# Patient Record
Sex: Female | Born: 1985 | Race: Black or African American | Hispanic: No | Marital: Single | Smoking: Never smoker
Health system: Southern US, Community
[De-identification: ages and names within clinical notes are randomized; demographics above are authoritative.]

## PROBLEM LIST (undated history)

## (undated) ENCOUNTER — Inpatient Hospital Stay (HOSPITAL_COMMUNITY): Payer: Self-pay

## (undated) DIAGNOSIS — G43909 Migraine, unspecified, not intractable, without status migrainosus: Secondary | ICD-10-CM

## (undated) DIAGNOSIS — R87619 Unspecified abnormal cytological findings in specimens from cervix uteri: Secondary | ICD-10-CM

## (undated) DIAGNOSIS — IMO0002 Reserved for concepts with insufficient information to code with codable children: Secondary | ICD-10-CM

---
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
## 2004-03-13 ENCOUNTER — Other Ambulatory Visit: Admission: RE | Admit: 2004-03-13 | Discharge: 2004-03-13 | Payer: Self-pay | Admitting: Obstetrics and Gynecology

## 2004-03-21 ENCOUNTER — Emergency Department (HOSPITAL_COMMUNITY): Admission: EM | Admit: 2004-03-21 | Discharge: 2004-03-21 | Payer: Self-pay | Admitting: Emergency Medicine

## 2004-09-14 ENCOUNTER — Other Ambulatory Visit: Admission: RE | Admit: 2004-09-14 | Discharge: 2004-09-14 | Payer: Self-pay | Admitting: Obstetrics and Gynecology

## 2005-04-11 ENCOUNTER — Other Ambulatory Visit: Admission: RE | Admit: 2005-04-11 | Discharge: 2005-04-11 | Payer: Self-pay | Admitting: Obstetrics and Gynecology

## 2005-08-15 ENCOUNTER — Inpatient Hospital Stay (HOSPITAL_COMMUNITY): Admission: AD | Admit: 2005-08-15 | Discharge: 2005-08-15 | Payer: Self-pay | Admitting: Obstetrics and Gynecology

## 2005-10-09 ENCOUNTER — Inpatient Hospital Stay (HOSPITAL_COMMUNITY): Admission: AD | Admit: 2005-10-09 | Discharge: 2005-10-10 | Payer: Self-pay | Admitting: Obstetrics and Gynecology

## 2005-10-14 HISTORY — PX: LEEP: SHX91

## 2005-10-20 ENCOUNTER — Inpatient Hospital Stay (HOSPITAL_COMMUNITY): Admission: AD | Admit: 2005-10-20 | Discharge: 2005-10-23 | Payer: Self-pay | Admitting: Obstetrics and Gynecology

## 2005-10-21 ENCOUNTER — Encounter (INDEPENDENT_AMBULATORY_CARE_PROVIDER_SITE_OTHER): Payer: Self-pay | Admitting: *Deleted

## 2005-12-03 ENCOUNTER — Other Ambulatory Visit: Admission: RE | Admit: 2005-12-03 | Discharge: 2005-12-03 | Payer: Self-pay | Admitting: Obstetrics and Gynecology

## 2005-12-05 ENCOUNTER — Inpatient Hospital Stay (HOSPITAL_COMMUNITY): Admission: AD | Admit: 2005-12-05 | Discharge: 2005-12-05 | Payer: Self-pay | Admitting: Obstetrics and Gynecology

## 2006-02-13 ENCOUNTER — Inpatient Hospital Stay (HOSPITAL_COMMUNITY): Admission: AD | Admit: 2006-02-13 | Discharge: 2006-02-13 | Payer: Self-pay | Admitting: Obstetrics and Gynecology

## 2006-04-29 ENCOUNTER — Other Ambulatory Visit: Admission: RE | Admit: 2006-04-29 | Discharge: 2006-04-29 | Payer: Self-pay | Admitting: Obstetrics and Gynecology

## 2006-05-28 ENCOUNTER — Inpatient Hospital Stay (HOSPITAL_COMMUNITY): Admission: AD | Admit: 2006-05-28 | Discharge: 2006-05-28 | Payer: Self-pay | Admitting: Obstetrics and Gynecology

## 2006-08-18 ENCOUNTER — Inpatient Hospital Stay (HOSPITAL_COMMUNITY): Admission: AD | Admit: 2006-08-18 | Discharge: 2006-08-18 | Payer: Self-pay | Admitting: Obstetrics and Gynecology

## 2006-10-16 ENCOUNTER — Other Ambulatory Visit: Admission: RE | Admit: 2006-10-16 | Discharge: 2006-10-16 | Payer: Self-pay | Admitting: Obstetrics and Gynecology

## 2008-02-10 ENCOUNTER — Emergency Department (HOSPITAL_COMMUNITY): Admission: EM | Admit: 2008-02-10 | Discharge: 2008-02-11 | Payer: Self-pay | Admitting: Emergency Medicine

## 2008-03-11 ENCOUNTER — Emergency Department (HOSPITAL_COMMUNITY): Admission: EM | Admit: 2008-03-11 | Discharge: 2008-03-11 | Payer: Self-pay | Admitting: Emergency Medicine

## 2008-06-24 ENCOUNTER — Emergency Department (HOSPITAL_COMMUNITY): Admission: EM | Admit: 2008-06-24 | Discharge: 2008-06-24 | Payer: Self-pay | Admitting: Emergency Medicine

## 2011-03-01 NOTE — Discharge Summary (Signed)
NAME:  Margaret Chaney, Margaret Chaney          ACCOUNT NO.:  [id number]   MEDICAL RECORD NO.:  [id number]          PATIENT TYPE:  INP   LOCATION:  9145                          FACILITY:  [facility]   PHYSICIAN:  James A. Ashley Royalty, M.D.DATE OF BIRTH:  1986/09/25   DATE OF ADMISSION:  10/20/2005  DATE OF DISCHARGE:  10/23/2005                                 DISCHARGE SUMMARY   DISCHARGE DIAGNOSES:  1.  Intrauterine pregnancy at 39 weeks and 1 day gestation, delivered.  2.  Term birth living child vertex.   OPERATIONS AND SPECIAL PROCEDURES:  OB delivery.   CONSULTATIONS:  None.   DISCHARGE MEDICATIONS:  Motrin 600 mg.   HISTORY AND PHYSICAL:  This is a 25 year old gravida 2, para 1 at [redacted] weeks  gestation.  Prenatal care had been uncomplicated until the patient felt  musculoskeletal discomfort near end at term.  She was noted to have advanced  cervical changes and hence was admitted for induction secondary to above.  For the remainder of the history and physical, please see chart.   HOSPITAL COURSE:  The patient was admitted to [facility]  [place].  Admission laboratory studies were drawn.  The patient went on  to labor and deliver on October 21, 2005.  The infant was a 7 pound 9 ounce  female.  Apgars 9 at one minute and 9 at five minutes, and sent to the  newborn nursery.  The delivery was accomplished by Dr. Lonell Face over an  intact perineum.  There were no lacerations.  The patient's postpartum  course was benign.  She was discharged on October 23, 2005 afebrile and in  satisfactory condition.   DISPOSITION:  The patient is to return to [facility] and Obstetrics  in four to six weeks for postpartum evaluation.      James A. Ashley Royalty, M.D.  Electronically Signed     JAM/MEDQ  D:  11/20/2005  T:  11/20/2005  Job:  [id number]

## 2011-03-01 NOTE — H&P (Signed)
NAME:  Chaney, Margaret          ACCOUNT NO.:  [id number]   MEDICAL RECORD NO.:  [id number]          PATIENT TYPE:  [facility]   LOCATION:                                FACILITY:  [facility]P   PHYSICIAN:  James A. Ashley Royalty, M.D.DATE OF BIRTH:  [birth year]   DATE OF ADMISSION:  10/20/2005  DATE OF DISCHARGE:                                HISTORY & PHYSICAL   This is a 25 year old gravida 2, para 1, EDC October 26, 2005 39 weeks 1 day  gestation.  Prenatal care has been essentially uncomplicated until recently  when the patient has developed musculoskeletal discomfort near and at term.  Recent cervical examination revealed 4 cm dilatation, 80% effaced, but -1 to  0 station and a vertex presentation.  Patient admitted for induction  secondary to above.   MEDICATIONS:  Vitamins.   PAST MEDICAL HISTORY:   MEDICAL:  Negative.   SURGICAL:  Negative.   ALLERGIES:  None disclosed.   FAMILY HISTORY:  Noncontributory.   SOCIAL HISTORY:  Patient denies use of tobacco or significant alcohol.   REVIEW OF SYSTEMS:  Noncontributory.   PHYSICAL EXAMINATION:  GENERAL:  Well-developed, well-nourished, pleasant  black female in no acute distress.  VITAL SIGNS:  Afebrile.  Vital signs stable.  CHEST:  Lungs are clear.  CARDIAC:  Regular rate and rhythm.  ABDOMEN:  Gravid with a term fundal height.  Fetal heart tones are  auscultated.  MUSCULOSKELETAL:  No CVA tenderness.  PELVIC:  (Please see October 17, 2005 office note).   IMPRESSION:  1.  Intrauterine pregnancy at 39 weeks 1 day gestation.  2.  Musculoskeletal discomfort.  3.  Advanced cervical changes.   PLAN:  1.  Admit.  2.  Induction in a.m.      James A. Ashley Royalty, M.D.  Electronically Signed     JAM/MEDQ  D:  10/18/2005  T:  10/18/2005  Job:  [id number]

## 2011-06-06 ENCOUNTER — Emergency Department (HOSPITAL_COMMUNITY): Payer: Self-pay

## 2011-06-06 ENCOUNTER — Emergency Department (HOSPITAL_COMMUNITY)
Admission: EM | Admit: 2011-06-06 | Discharge: 2011-06-06 | Disposition: A | Discharge status: Home / Self Care | Payer: Self-pay | Attending: Emergency Medicine | Admitting: Emergency Medicine

## 2011-06-06 DIAGNOSIS — R10819 Abdominal tenderness, unspecified site: Secondary | ICD-10-CM | POA: Insufficient documentation

## 2011-06-06 DIAGNOSIS — N898 Other specified noninflammatory disorders of vagina: Secondary | ICD-10-CM | POA: Insufficient documentation

## 2011-06-06 DIAGNOSIS — O99891 Other specified diseases and conditions complicating pregnancy: Secondary | ICD-10-CM | POA: Insufficient documentation

## 2011-06-06 DIAGNOSIS — R112 Nausea with vomiting, unspecified: Secondary | ICD-10-CM | POA: Insufficient documentation

## 2011-06-06 DIAGNOSIS — R109 Unspecified abdominal pain: Secondary | ICD-10-CM | POA: Insufficient documentation

## 2011-06-06 LAB — URINALYSIS, ROUTINE W REFLEX MICROSCOPIC
Bilirubin Urine: NEGATIVE
Glucose, UA: NEGATIVE mg/dL
Ketones, ur: NEGATIVE mg/dL
Leukocytes, UA: NEGATIVE
Nitrite: NEGATIVE
Protein, ur: NEGATIVE mg/dL
Specific Gravity, Urine: 1.024 (ref 1.005–1.030)
Urobilinogen, UA: 1 mg/dL (ref 0.0–1.0)
pH: 6 (ref 5.0–8.0)

## 2011-06-06 LAB — WET PREP, GENITAL
Clue Cells Wet Prep HPF POC: NONE SEEN
Trich, Wet Prep: NONE SEEN
Yeast Wet Prep HPF POC: NONE SEEN

## 2011-06-06 LAB — URINE MICROSCOPIC-ADD ON

## 2011-06-06 LAB — HCG, QUANTITATIVE, PREGNANCY: hCG, Beta Chain, Quant, S: 15156 m[IU]/mL — ABNORMAL HIGH (ref <5)

## 2011-06-07 LAB — GC/CHLAMYDIA PROBE AMP, GENITAL
Chlamydia, DNA Probe: NEGATIVE
GC Probe Amp, Genital: NEGATIVE

## 2011-07-17 LAB — WET PREP, GENITAL
Clue Cells Wet Prep HPF POC: NONE SEEN
Trich, Wet Prep: NONE SEEN
Yeast Wet Prep HPF POC: NONE SEEN

## 2011-07-17 LAB — GC/CHLAMYDIA PROBE AMP, GENITAL: Chlamydia, DNA Probe: NEGATIVE

## 2011-07-17 LAB — URINE CULTURE: Colony Count: 85000

## 2011-07-17 LAB — URINALYSIS, ROUTINE W REFLEX MICROSCOPIC
Bilirubin Urine: NEGATIVE
Ketones, ur: NEGATIVE
Nitrite: NEGATIVE
Protein, ur: NEGATIVE
Urobilinogen, UA: 1

## 2011-09-04 ENCOUNTER — Encounter: Payer: Self-pay | Admitting: Emergency Medicine

## 2011-09-04 ENCOUNTER — Emergency Department (HOSPITAL_COMMUNITY)
Admission: EM | Admit: 2011-09-04 | Discharge: 2011-09-04 | Disposition: A | Discharge status: Home / Self Care | Payer: Self-pay | Attending: Emergency Medicine | Admitting: Emergency Medicine

## 2011-09-04 DIAGNOSIS — R51 Headache: Secondary | ICD-10-CM | POA: Insufficient documentation

## 2011-09-04 MED ORDER — GUAIFENESIN ER 1200 MG PO TB12: 1.0000 | ORAL_TABLET | Freq: Two times a day (BID) | ORAL | Status: DC

## 2011-09-04 MED ORDER — KETOROLAC TROMETHAMINE 60 MG/2ML IM SOLN
60.0000 mg | Freq: Once | INTRAMUSCULAR | Status: AC
  Administered 2011-09-04: 60 mg via INTRAMUSCULAR
  Filled 2011-09-04: qty 2

## 2011-09-04 MED ORDER — IBUPROFEN 800 MG PO TABS: 800.0000 mg | ORAL_TABLET | Freq: Three times a day (TID) | ORAL | Status: AC | PRN

## 2011-09-04 MED ORDER — PREDNISONE 50 MG PO TABS: 50.0000 mg | ORAL_TABLET | Freq: Every day | ORAL | Status: AC

## 2011-09-04 NOTE — ED Notes (Signed)
In ER c/o headache x2 days. Denies any nausea vomiting. Patient stated she has had headache in the past, last one before this was 2 weeks ago

## 2011-09-04 NOTE — ED Provider Notes (Signed)
History     CSN: [id number] Arrival date & time: 09/04/2011  5:54 AM   First MD Initiated Contact with Patient 09/04/11 [phone]      Chief Complaint  Patient presents with  . Headache    headache x2 days    (Consider location/radiation/quality/duration/timing/severity/associated sxs/prior treatment) Patient is a 25 y.o. female presenting with headaches. The history is provided by the patient.  Headache  The current episode started 2 days ago. The problem occurs constantly. The problem has not changed since onset.The headache is associated with nothing. The pain is located in the frontal region. The quality of the pain is described as throbbing. The pain does not radiate. Pertinent negatives include no anorexia, no fever, no near-syncope, no palpitations, no syncope, no shortness of breath, no nausea and no vomiting. She has tried nothing for the symptoms.    History reviewed. No pertinent past medical history.  History reviewed. No pertinent past surgical history.  History reviewed. No pertinent family history.  History  Substance Use Topics  . Smoking status: Never Smoker   . Smokeless tobacco: Not on file  . Alcohol Use: No    OB History    Grav Para Term Preterm Abortions TAB SAB Ect Mult Living                  Review of Systems  Constitutional: Negative for fever.  Respiratory: Negative for shortness of breath.   Cardiovascular: Negative for palpitations, syncope and near-syncope.  Gastrointestinal: Negative for nausea, vomiting and anorexia.  Neurological: Positive for headaches.  All other systems reviewed and are negative.    Allergies  Review of patient's allergies indicates no known allergies.  Home Medications   Current Outpatient Rx  Name Route Sig Dispense Refill  . ASPIRIN EC 81 MG PO TBEC Oral Take 81 mg by mouth every 6 (six) hours as needed. pain     . CETIRIZINE HCL 10 MG PO TABS Oral Take 10 mg by mouth daily. Allergic reaction     .  MEDROXYPROGESTERONE ACETATE 150 MG/ML IM SUSP Intramuscular Inject 150 mg into the muscle every 3 (three) months.        BP 120/81  Pulse 76  Temp(Src) 98.6 F (37 C) (Oral)  Resp 16  Ht 5\' 4"  (1.626 m)  Wt 119 lb (53.978 kg)  BMI 20.43 kg/m2  SpO2 99%  LMP 08/29/2011  Physical Exam  Nursing note and vitals reviewed. Constitutional: She is oriented to person, place, and time. She appears well-developed and well-nourished. No distress.  HENT:  Head: Normocephalic and atraumatic.  Right Ear: Tympanic membrane, external ear and ear canal normal.  Left Ear: Tympanic membrane, external ear and ear canal normal.  Nose: No mucosal edema, rhinorrhea, sinus tenderness or nasal deformity. Right sinus exhibits no maxillary sinus tenderness and no frontal sinus tenderness. Left sinus exhibits no maxillary sinus tenderness and no frontal sinus tenderness.  Mouth/Throat: Uvula is midline, oropharynx is clear and moist and mucous membranes are normal.  Eyes: EOM are normal. Pupils are equal, round, and reactive to light.  Neck: Normal range of motion. Neck supple.  Cardiovascular: Normal rate, regular rhythm and normal heart sounds.   Pulmonary/Chest: Effort normal and breath sounds normal.  Neurological: She is alert and oriented to person, place, and time. She has normal strength and normal reflexes. No cranial nerve deficit or sensory deficit. Gait normal. GCS eye subscore is 4. GCS verbal subscore is 5. GCS motor subscore is 6.  Skin: Skin  is warm and dry. No rash noted.    ED Course  Procedures (including critical care time)          MDM  Patient states, that she feels like to her sinuses are causing her headache.  The patient denies any nasal congestion, rhinorrhea, or upper respiratory symptoms.  She has not had any tenderness to her sinuses on percussion of treatment.  For her headache, and her followup with her primary care or urgent care center for recheck.  Told to return here  as needed.  Patient has no neurological deficits on exam, and this headache does not seem to follow a migraine type pattern.  This could be related to allergic type symptoms.        Carlyle Dolly, PA 09/04/11 0646  Carlyle Dolly, PA 09/04/11 [phone]  Carlyle Dolly, PA 09/04/11 0701  Carlyle Dolly, PA 09/04/11 [phone]

## 2011-09-05 NOTE — ED Provider Notes (Signed)
Medical screening examination/treatment/procedure(s) were performed by non-physician practitioner and as supervising physician I was immediately available for consultation/collaboration.   Gwyneth Sprout, MD 09/05/11 [phone]

## 2011-10-02 ENCOUNTER — Encounter (HOSPITAL_COMMUNITY): Payer: Self-pay | Admitting: Emergency Medicine

## 2011-10-02 ENCOUNTER — Emergency Department (HOSPITAL_COMMUNITY)
Admission: EM | Admit: 2011-10-02 | Discharge: 2011-10-02 | Disposition: A | Discharge status: Home / Self Care | Payer: Self-pay | Attending: Emergency Medicine | Admitting: Emergency Medicine

## 2011-10-02 ENCOUNTER — Other Ambulatory Visit: Payer: Self-pay

## 2011-10-02 DIAGNOSIS — R42 Dizziness and giddiness: Secondary | ICD-10-CM | POA: Insufficient documentation

## 2011-10-02 LAB — URINALYSIS, ROUTINE W REFLEX MICROSCOPIC
Bilirubin Urine: NEGATIVE
Glucose, UA: NEGATIVE mg/dL
Hgb urine dipstick: NEGATIVE
Ketones, ur: NEGATIVE mg/dL
Leukocytes, UA: NEGATIVE
Nitrite: NEGATIVE
Protein, ur: NEGATIVE mg/dL
Specific Gravity, Urine: 1.029 (ref 1.005–1.030)
Urobilinogen, UA: 1 mg/dL (ref 0.0–1.0)
pH: 8 (ref 5.0–8.0)

## 2011-10-02 LAB — POCT PREGNANCY, URINE: Preg Test, Ur: NEGATIVE

## 2011-10-02 NOTE — ED Notes (Signed)
PT. REPORTS DIZZINESS "FEELING FAINT"  ONSET THIS EVENING WITH HEADACHE AND NAUSEA.  ALSO REPORTS DYSURIA.

## 2011-10-02 NOTE — ED Provider Notes (Signed)
History     25yF with dizziness. Describes as sensation of lightheadedness. No true vertigo. Gradual onset this evening. Mild HA. Diffuse, achy and constant. No appreciable exacerbating or relieving factors. NO neck pain or stiffness. No fever or chills. No numbness tingling or less of strength. Denies trauma. Nausea. No vomiting. No cp or sob. Doesn't think pregnant. No urinary complaints.  CSN: [id number] Arrival date & time: 10/02/2011 12:54 AM   First MD Initiated Contact with Patient 10/02/11 0121      Chief Complaint  Patient presents with  . Dizziness    (Consider location/radiation/quality/duration/timing/severity/associated sxs/prior treatment) HPI  History reviewed. No pertinent past medical history.  History reviewed. No pertinent past surgical history.  No family history on file.  History  Substance Use Topics  . Smoking status: Never Smoker   . Smokeless tobacco: Not on file  . Alcohol Use: No    OB History    Grav Para Term Preterm Abortions TAB SAB Ect Mult Living                  Review of Systems   Review of symptoms negative unless otherwise noted in HPI.   Allergies  Review of patient's allergies indicates no known allergies.  Home Medications   Current Outpatient Rx  Name Route Sig Dispense Refill  . ASPIRIN EC 81 MG PO TBEC Oral Take 81 mg by mouth every 6 (six) hours as needed. pain     . CETIRIZINE HCL 10 MG PO TABS Oral Take 10 mg by mouth daily. Allergic reaction     . GUAIFENESIN ER 1200 MG PO TB12 Oral Take 1 tablet (1,200 mg total) by mouth 2 (two) times daily. 20 each 0  . HYDROCODONE-ACETAMINOPHEN 5-325 MG PO TABS Oral Take 1 tablet by mouth every 6 (six) hours as needed. pain     . MEDROXYPROGESTERONE ACETATE 150 MG/ML IM SUSP Intramuscular Inject 150 mg into the muscle every 3 (three) months.        BP 123/65  Pulse 68  Temp(Src) 98.6 F (37 C) (Oral)  Resp 18  SpO2 100%  LMP 08/29/2011  Physical Exam  Nursing note and  vitals reviewed. Constitutional: She is oriented to person, place, and time. She appears well-developed and well-nourished. No distress.  HENT:  Head: Normocephalic and atraumatic.  Eyes: Conjunctivae are normal. Pupils are equal, round, and reactive to light. Right eye exhibits no discharge. Left eye exhibits no discharge.  Neck: Neck supple.  Cardiovascular: Normal rate, regular rhythm and normal heart sounds.  Exam reveals no gallop and no friction rub.   No murmur heard. Pulmonary/Chest: Effort normal and breath sounds normal. No respiratory distress. She has no wheezes. She has no rales. She exhibits no tenderness.  Abdominal: Soft. She exhibits no distension. There is no tenderness.  Musculoskeletal: She exhibits no edema and no tenderness.  Neurological: She is alert and oriented to person, place, and time. No cranial nerve deficit. She exhibits normal muscle tone. Coordination normal.       Good finger-to-nose. Normal gait.  Skin: Skin is warm and dry. She is not diaphoretic.  Psychiatric: She has a normal mood and affect. Her behavior is normal. Thought content normal.    ED Course  Procedures (including critical care time)   Labs Reviewed  URINALYSIS, ROUTINE W REFLEX MICROSCOPIC  POCT PREGNANCY, URINE  LAB REPORT - SCANNED   No results found.  EKG:  Rhythm: sinus arrythmia Rate: 64  Axis: normal Intervals: normal ST  segments: normal  1. Dizziness - light-headed       MDM  25yF with dizziness consider dehydration, lytes, anemia, pregnancy, pe, viral, etc. ekg non concerning. Not pregnant. Pt clinically well appearing with nonfocal exam. HD stable. Do not feel further w/u indicated at this time. outpt fu.        Raeford Razor, MD 10/10/11 [phone]

## 2011-10-02 NOTE — ED Notes (Signed)
Pt ambulated with a steady gait; VSS; A&Ox3; no signs of distress; respirations even and unlabored; skin warm and dry; no complaints; no questions at this time.

## 2011-11-28 ENCOUNTER — Encounter (HOSPITAL_COMMUNITY): Payer: Self-pay | Admitting: Emergency Medicine

## 2011-11-28 ENCOUNTER — Emergency Department (HOSPITAL_COMMUNITY)
Admission: EM | Admit: 2011-11-28 | Discharge: 2011-11-28 | Disposition: A | Discharge status: Home / Self Care | Payer: Medicaid Other | Attending: Emergency Medicine | Admitting: Emergency Medicine

## 2011-11-28 ENCOUNTER — Emergency Department (HOSPITAL_COMMUNITY): Payer: Medicaid Other

## 2011-11-28 ENCOUNTER — Other Ambulatory Visit: Payer: Self-pay

## 2011-11-28 DIAGNOSIS — R0602 Shortness of breath: Secondary | ICD-10-CM

## 2011-11-28 DIAGNOSIS — R079 Chest pain, unspecified: Secondary | ICD-10-CM | POA: Insufficient documentation

## 2011-11-28 MED ORDER — ALBUTEROL SULFATE HFA 108 (90 BASE) MCG/ACT IN AERS
2.0000 | INHALATION_SPRAY | Freq: Once | RESPIRATORY_TRACT | Status: AC
  Administered 2011-11-28: 2 via RESPIRATORY_TRACT
  Filled 2011-11-28: qty 6.7

## 2011-11-28 NOTE — ED Provider Notes (Signed)
History     CSN: [id number]  Arrival date & time 11/28/11  1759  9:50 PM  HPI Patient reports the onset of left sided chest pain that occurred today. Reports having 3 episodes throughout the day. States episode lasted approximately 10-15 minutes. Describes pain as sharp constant. Reports associated with shortness of breath. Currently pain is 0/10. Worst pain is 10/10. Denies hypertension, hyperlipidemia, cough, wheezing, recent immobilization, surgery, fever, sore throat. No significant history or risk factors for ACS, DVT, PE, pneumonia, PTX. Patient however is on Depo injections.  Patient is a 26 y.o. female presenting with chest pain. The history is provided by the patient.  Chest Pain Episode onset: today. Duration of episode(s) is 10 minutes. Chest pain occurs intermittently. The chest pain is resolved. The severity of the pain is moderate. The quality of the pain is described as sharp. The pain does not radiate. Primary symptoms include shortness of breath. Pertinent negatives for primary symptoms include no fever, no fatigue, no syncope, no cough, no wheezing, no palpitations, no abdominal pain, no nausea, no vomiting, no dizziness and no altered mental status. She tried nothing for the symptoms. Risk factors include no known risk factors.  Pertinent negatives for past medical history include no aortic dissection, no cancer, no CHF, no diabetes, no DVT, no hyperlipidemia, no hypertension and no MI.  Pertinent negatives for family medical history include: no sudden death in family.     History reviewed. No pertinent past medical history.  History reviewed. No pertinent past surgical history.  History reviewed. No pertinent family history.  History  Substance Use Topics  . Smoking status: Never Smoker   . Smokeless tobacco: Not on file  . Alcohol Use: No    OB History    Grav Para Term Preterm Abortions TAB SAB Ect Mult Living                  Review of Systems    Constitutional: Positive for chills. Negative for fever and fatigue.  HENT: Negative for congestion, sore throat and rhinorrhea.   Respiratory: Positive for shortness of breath. Negative for cough and wheezing.   Cardiovascular: Positive for chest pain. Negative for palpitations and syncope.  Gastrointestinal: Negative for nausea, vomiting and abdominal pain.  Neurological: Negative for dizziness.  Psychiatric/Behavioral: Negative for altered mental status.  All other systems reviewed and are negative.    Allergies  Review of patient's allergies indicates no known allergies.  Home Medications   Current Outpatient Rx  Name Route Sig Dispense Refill  . MEDROXYPROGESTERONE ACETATE 150 MG/ML IM SUSP Intramuscular Inject 150 mg into the muscle every 3 (three) months. Last injection was in Jan 30th    . ADULT MULTIVITAMIN W/MINERALS CH Oral Take 1 tablet by mouth daily.      BP 116/76  Pulse 57  Temp(Src) 98.7 F (37.1 C) (Oral)  Resp 17  SpO2 100%  LMP 11/19/2011  Physical Exam  Vitals reviewed. Constitutional: She is oriented to person, place, and time. Vital signs are normal. She appears well-developed and well-nourished.  HENT:  Head: Normocephalic and atraumatic.  Eyes: Conjunctivae are normal. Pupils are equal, round, and reactive to light.  Neck: Normal range of motion. Neck supple.  Cardiovascular: Normal rate, regular rhythm and normal heart sounds.  Exam reveals no gallop and no friction rub.   No murmur heard. Pulmonary/Chest: Effort normal and breath sounds normal. She has no wheezes. She has no rhonchi. She has no rales. She exhibits no tenderness.  Musculoskeletal: Normal range of motion.  Neurological: She is alert and oriented to person, place, and time. Coordination normal.  Skin: Skin is warm and dry. No rash noted. No erythema. No pallor.    ED Course  Procedures    Date: 11/28/2011  Rate: 62  Rhythm: sinus arrhythmia, normal sinus rhythm  QRS Axis:  normal  Intervals: normal  ST/T Wave abnormalities: normal  Conduction Disutrbances:none  Narrative Interpretation:   Old EKG Reviewed: unchanged since 10/02/11  Dg Chest 2 View  11/28/2011  *RADIOLOGY REPORT*  Clinical Data: Chest pain.  Shortness of breath.  Smoker.  CHEST - 2 VIEW  Comparison: None.  Findings: Mild hyperinflation compatible with emphysema.  No focal airspace consolidation in the lungs.  No blunting of costophrenic angles.  No pneumothorax.  Hila appear symmetrical.  Normal heart size and pulmonary vascularity.  IMPRESSION: Mild emphysematous changes.  No evidence of active pulmonary disease.  Original Report Authenticated By: Marlon Pel, M.D.      MDM    Pain albuterol 2 to the symptoms changes in her chest x-ray. For worsening symptoms. Patient currently pain free and has not breath. Patient agrees with plan and is ready for discharge.     Thomasene Lot, PA-C 11/28/11 2216

## 2011-11-28 NOTE — ED Provider Notes (Signed)
Medical screening examination/treatment/procedure(s) were performed by non-physician practitioner and as supervising physician I was immediately available for consultation/collaboration.  Ethelda Chick, MD 11/28/11 2258

## 2011-11-28 NOTE — Discharge Instructions (Signed)
Chest Pain (Nonspecific) It is often hard to give a specific diagnosis for the cause of chest pain. There is always a chance that your pain could be related to something serious, such as a heart attack or a blood clot in the lungs. You need to follow up with your caregiver for further evaluation. CAUSES   Heartburn.   Pneumonia or bronchitis.   Anxiety and stress.   Inflammation around your heart (pericarditis) or lung (pleuritis or pleurisy).   A blood clot in the lung.   A collapsed lung (pneumothorax). It can develop suddenly on its own (spontaneous pneumothorax) or from injury (trauma) to the chest.  The chest wall is composed of bones, muscles, and cartilage. Any of these can be the source of the pain.  The bones can be bruised by injury.   The muscles or cartilage can be strained by coughing or overwork.   The cartilage can be affected by inflammation and become sore (costochondritis).  DIAGNOSIS  Lab tests or other studies, such as X-rays, an EKG, stress testing, or cardiac imaging, may be needed to find the cause of your pain.  TREATMENT   Treatment depends on what may be causing your chest pain. Treatment may include:   Acid blockers for heartburn.   Anti-inflammatory medicine.   Pain medicine for inflammatory conditions.   Antibiotics if an infection is present.   You may be advised to change lifestyle habits. This includes stopping smoking and avoiding caffeine and chocolate.   You may be advised to keep your head raised (elevated) when sleeping. This reduces the chance of acid going backward from your stomach into your esophagus.   Most of the time, nonspecific chest pain will improve within 2 to 3 days with rest and mild pain medicine.  HOME CARE INSTRUCTIONS   If antibiotics were prescribed, take the full amount even if you start to feel better.   For the next few days, avoid physical activities that bring on chest pain. Continue physical activities as  directed.   Do not smoke cigarettes or drink alcohol until your symptoms are gone.   Only take over-the-counter or prescription medicine for pain, discomfort, or fever as directed by your caregiver.   Follow your caregiver's suggestions for further testing if your chest pain does not go away.   Keep any follow-up appointments you made. If you do not go to an appointment, you could develop lasting (chronic) problems with pain. If there is any problem keeping an appointment, you must call to reschedule.  SEEK MEDICAL CARE IF:   You think you are having problems from the medicine you are taking. Read your medicine instructions carefully.   Your chest pain does not go away, even after treatment.   You develop a rash with blisters on your chest.  SEEK IMMEDIATE MEDICAL CARE IF:   You have increased chest pain or pain that spreads to your arm, neck, jaw, back, or belly (abdomen).   You develop shortness of breath, an increasing cough, or you are coughing up blood.   You have severe back or abdominal pain, feel sick to your stomach (nauseous) or throw up (vomit).   You develop severe weakness, fainting, or chills.   You have an oral temperature above 102 F (38.9 C), not controlled by medicine.  THIS IS AN EMERGENCY. Do not wait to see if the pain will go away. Get medical help at once. Call your local emergency services (911 in [place] Do not drive yourself to   the hospital. MAKE SURE YOU:   Understand these instructions.   Will watch your condition.   Will get help right away if you are not doing well or get worse.  Document Released: 07/10/2005 Document Revised: 06/12/2011 Document Reviewed: 05/05/2008 [facility] Patient Information 2012 [place], [place].Shortness of Breath Shortness of breath (dyspnea) is the feeling of uneasy breathing. Shortness of breath does not always mean that there is a life threatening illness. Dyspnea needs care right away. CAUSES  The list of causes for  shortness of breath is very long and includes:  Not enough oxygen in the air (as with high altitudes or with a smoke-filled room).   Acute lung disease.   Infections such as pneumonia.   Fluid in the lungs, such as heart failure.   Blood clot in the lungs (pulmonary embolism).   Lasting (chronic) lung diseases.   Heart Disease (heart attack, angina, heart failure, and others).   Low red blood cells (anemia).   Poor physical fitness can cause shortness of breath with exercise.   Chest or back injuries or stiffness.   Being overweight (obese) can make it hard to breath.   Anxiety can make you feel like you are not getting enough air even though everything is all right.  DIAGNOSIS  Many tests may be done to find why you are having shortness of breath. Tests include:  Chest X-rays.   Lung function tests.   Blood tests.   Electrocardiogram.   Exercise testing.   A cardiac echo.   Scans.  Serious medical problems will usually be found during your exam. Your caregiver may not be able to find a cause for your shortness of breath after looking at you. In this case, it is important to have a follow-up and exam with your caregiver after a period of time.  HOME CARE INSTRUCTIONS   Do not smoke. Smoking is a common cause of shortness of breath. Ask for help to stop smoking.   Avoid being around chemicals that may bother your breathing (paint fumes, dust, etc.).   Rest as needed. Slowly begin your usual activities.   If medications were prescribed, take them as directed for the full length of time directed. This includes oxygen and any inhaled medications, if prescribed.   It is very important that you follow up with your caregiver or other physician as directed. Waiting to do so or failure to follow-up could result in worsening of your condition and possible disability or death.   Be sure you understand what to do or who to call if your shortness of breath worsens.  SEEK  MEDICAL CARE IF:   Your condition does not improve in the time expected.   You have a hard time doing your normal activities even with rest.   You have any side effects from or problems with medications prescribed.   You develop any new symptoms not discussed below.  SEEK IMMEDIATE MEDICAL CARE IF:   You feel your shortness of breath is getting worse.   You feel lightheaded, faint or develop a cough not controlled with medications.   You start coughing up blood.   You get pain with breathing.   You get chest pain or pain in your arms, shoulders or belly (abdomen).   You have a fever.   You are unable to walk up stairs or exercise the way you normally can.   If any of the symptoms, which brought you into the emergency room before, are getting worse and not  better.  Document Released: 06/25/2001 Document Revised: 06/12/2011 Document Reviewed: 02/10/2008 [facility] Patient Information 2012 [place], [place].

## 2011-11-28 NOTE — ED Notes (Signed)
MD at bedside. 

## 2011-11-28 NOTE — ED Notes (Signed)
Patient transported to X-ray 

## 2011-11-28 NOTE — ED Notes (Signed)
Pt c/o midsternal CP with SOB intermittently starting today; pt denies cold or cough

## 2011-12-24 ENCOUNTER — Inpatient Hospital Stay (HOSPITAL_COMMUNITY)
Admission: AD | Admit: 2011-12-24 | Discharge: 2011-12-24 | Disposition: A | Discharge status: Home / Self Care | Payer: Medicaid Other | Source: Ambulatory Visit | Attending: Obstetrics & Gynecology | Admitting: Obstetrics & Gynecology

## 2011-12-24 ENCOUNTER — Encounter (HOSPITAL_COMMUNITY): Payer: Self-pay | Admitting: *Deleted

## 2011-12-24 DIAGNOSIS — N938 Other specified abnormal uterine and vaginal bleeding: Secondary | ICD-10-CM | POA: Insufficient documentation

## 2011-12-24 DIAGNOSIS — N926 Irregular menstruation, unspecified: Secondary | ICD-10-CM

## 2011-12-24 DIAGNOSIS — N949 Unspecified condition associated with female genital organs and menstrual cycle: Secondary | ICD-10-CM | POA: Insufficient documentation

## 2011-12-24 DIAGNOSIS — N760 Acute vaginitis: Secondary | ICD-10-CM | POA: Insufficient documentation

## 2011-12-24 HISTORY — DX: Reserved for concepts with insufficient information to code with codable children: IMO0002

## 2011-12-24 HISTORY — DX: Unspecified abnormal cytological findings in specimens from cervix uteri: R87.619

## 2011-12-24 LAB — URINALYSIS, ROUTINE W REFLEX MICROSCOPIC
Bilirubin Urine: NEGATIVE
Nitrite: NEGATIVE
Specific Gravity, Urine: 1.025 (ref 1.005–1.030)
pH: 6.5 (ref 5.0–8.0)

## 2011-12-24 LAB — WET PREP, GENITAL

## 2011-12-24 LAB — URINE MICROSCOPIC-ADD ON

## 2011-12-24 MED ORDER — AZITHROMYCIN 250 MG PO TABS: ORAL_TABLET | ORAL | Status: DC

## 2011-12-24 MED ORDER — NAPROXEN SODIUM 550 MG PO TABS: 550.0000 mg | ORAL_TABLET | Freq: Two times a day (BID) | ORAL | Status: DC

## 2011-12-24 NOTE — Discharge Instructions (Signed)
Abnormal Uterine Bleeding Abnormal uterine bleeding can have many causes. Some cases are simply treated, while others are more serious. There are several kinds of bleeding that is considered abnormal, including:  Bleeding between periods.   Bleeding after sexual intercourse.   Spotting anytime in the menstrual cycle.   Bleeding heavier or more than normal.   Bleeding after menopause.  CAUSES  There are many causes of abnormal uterine bleeding. It can be present in teenagers, pregnant women, women during their reproductive years, and women who have reached menopause. Your caregiver will look for the more common causes depending on your age, signs, symptoms and your particular circumstance. Most cases are not serious and can be treated. Even the more serious causes, like cancer of the female organs, can be treated adequately if found in the early stages. That is why all types of bleeding should be evaluated and treated as soon as possible. DIAGNOSIS  Diagnosing the cause may take several kinds of tests. Your caregiver may:  Take a complete history of the type of bleeding.   Perform a complete physical exam and Pap smear.   Take an ultrasound on the abdomen showing a picture of the female organs and the pelvis.   Inject dye into the uterus and Fallopian tubes and X-ray them (hysterosalpingogram).   Place fluid in the uterus and do an ultrasound (sonohysterogrqphy).   Take a CT scan to examine the female organs and pelvis.   Take an MRI to examine the female organs and pelvis. There is no X-ray involved with this procedure.   Look inside the uterus with a telescope that has a light at the end (hysteroscopy).   Scrap the inside of the uterus to get tissue to examine (Dilatation and Curettage, D&C).   Look into the pelvis with a telescope that has a light at the end (laparoscopy). This is done through a very small cut (incision) in the abdomen.  TREATMENT  Treatment will depend on the  cause of the abnormal bleeding. It can include:  Doing nothing to allow the problem to take care of itself over time.   Hormone treatment.   Birth control pills.   Treating the medical condition causing the problem.   Laparoscopy.   Major or minor surgery   Destroying the lining of the uterus with electrical currant, laser, freezing or heat (uterine ablation).  HOME CARE INSTRUCTIONS   Follow your caregiver's recommendation on how to treat your problem.   See your caregiver if you missed a menstrual period and think you may be pregnant.   If you are bleeding heavily, count the number of pads/tampons you use and how often you have to change them. Tell this to your caregiver.   Avoid sexual intercourse until the problem is controlled.  SEEK MEDICAL CARE IF:   You have any kind of abnormal bleeding mentioned above.   You feel dizzy at times.   You are 26 years old and have not had a menstrual period yet.  SEEK IMMEDIATE MEDICAL CARE IF:   You pass out.   You are changing pads/tampons every 15 to 30 minutes.   You have belly (abdominal) pain.   You have a temperature of 100 F (37.8 C) or higher.   You become sweaty or weak.   You are passing large blood clots from the vagina.   You start to feel sick to your stomach (nauseous) and throw up (vomit).  Document Released: 09/30/2005 Document Revised: 09/19/2011 Document Reviewed: 02/23/2009 [employer]   Patient Information 2012 [place], [place].Vaginitis Vaginitis is an infection. It causes soreness, swelling, and redness (inflammation) of the vagina. Many of these infections are sexually transmitted diseases (STDs). Having unprotected sex can cause further problems and complications such as:  Chronic pelvic pain.   Infertility.   Unwanted pregnancy.   Abortion.   Tubal pregnancy.   Infection passed on to the newborn.   Cancer.  CAUSES   Monilia. This is a yeast or fungus infection, not an STD.   Bacterial  vaginosis. The normal balance of bacteria in the vagina is disrupted and is replaced by an overgrowth of certain bacteria.   Gonorrhea, chlamydia. These are bacterial infections that are STDs.   Vaginal sponges, diaphragms, and intrauterine devices.   Trichomoniasis. This is a STD infection caused by a parasite.   Viruses like herpes and human papillomavirus. Both are STDs.   Pregnancy.   Immunosuppression. This occurs with certain conditions such as HIV infection or cancer.   Using bubble bath.   Taking certain antibiotic medicines.   Sporadic recurrence can occur if you become sick.   Diabetes.   Steroids.   Allergic reaction. If you have an allergy to:   Douches.   Soaps.   Spermicides.   Condoms.   Scented tampons or vaginal sprays.  SYMPTOMS   Abnormal vaginal discharge.   Itching of the vagina.   Pain in the vagina.   Swelling of the vagina.  In some cases, there are no symptoms. TREATMENT  Treatment will vary depending on the type of infection.  Bacteria or trichomonas are usually treated with oral antibiotics and sometimes vaginal cream or suppositories.   Monilia vaginitis is usually treated with vaginal creams, suppositories, or oral antifungal pills.   Viral vaginitis has no cure. However, the symptoms of herpes (a viral vaginitis) can be treated to relieve the discomfort. Human papillomavirus has no symptoms. However, there are treatments for the diseases caused by human papillomavirus.   With allergic vaginitis, you need to stop using the product that is causing the problem. Vaginal creams can be used to treat the symptoms.   When treating an STD, the sex partner should also be treated.  HOME CARE INSTRUCTIONS   Take all the medicines as directed by your caregiver.   Do not use scented tampons, soaps, or vaginal sprays.   Do not douche.   Tell your sex partner if you have a vaginal infection or an STD.   Do not have sexual intercourse  until you have treated the vaginitis.   Practice safe sex by using condoms.  SEEK MEDICAL CARE IF:   You have abdominal pain.   Your symptoms get worse during treatment.  Document Released: 07/28/2007 Document Revised: 09/19/2011 Document Reviewed: 03/23/2009 [facility] Patient Information 2012 [place], [place].

## 2011-12-24 NOTE — MAU Provider Note (Signed)
History   Pt presents today c/o irregular vag bleeding, vag dc, and lower abd cramping since jan. She states she had her first Depo-provera injection in Jan. At that time she was also told she had a cervical polyp. She denies fever, vag irritation, dysuria, or any other problems at this time.  CSN: [id number]  Arrival date and time: 12/24/11 1313   First Provider Initiated Contact with Patient 12/24/11 1510      No chief complaint on file.  HPI  OB History    Grav Para Term Preterm Abortions TAB SAB Ect Mult Living   2 2 2       2       Past Medical History  Diagnosis Date  . Abnormal Pap smear     Past Surgical History  Procedure Date  . Leep 2007  . Laser ablation of the cervix 2003    Family History  Problem Relation Age of Onset  . Hypertension Mother     History  Substance Use Topics  . Smoking status: Never Smoker   . Smokeless tobacco: Not on file  . Alcohol Use: No    Allergies: No Known Allergies  Prescriptions prior to admission  Medication Sig Dispense Refill  . medroxyPROGESTERone (DEPO-PROVERA) 150 MG/ML injection Inject 150 mg into the muscle every 3 (three) months. Last injection was in Jan 30th      . Multiple Vitamin (MULITIVITAMIN WITH MINERALS) TABS Take 1 tablet by mouth daily.        Review of Systems  Constitutional: Negative for fever and chills.  Eyes: Negative for blurred vision and double vision.  Respiratory: Negative for cough, hemoptysis, sputum production, shortness of breath and wheezing.   Cardiovascular: Negative for chest pain and palpitations.  Gastrointestinal: Positive for abdominal pain. Negative for nausea, vomiting, diarrhea and constipation.  Genitourinary: Negative for dysuria, urgency, frequency and hematuria.  Neurological: Negative for dizziness and headaches.  Psychiatric/Behavioral: Negative for depression and suicidal ideas.   Physical Exam   Blood pressure 131/85, pulse 68, temperature 99.1 F (37.3 C),  temperature source Oral, resp. rate 16, height 5' 4.5" (1.638 m), weight 114 lb 12.8 oz (52.073 kg), last menstrual period 11/15/2011, SpO2 100.00%.  Physical Exam  Nursing note and vitals reviewed. Constitutional: She is oriented to person, place, and time. She appears well-developed and well-nourished. No distress.  HENT:  Head: Normocephalic and atraumatic.  Eyes: EOM are normal. Pupils are equal, round, and reactive to light.  GI: Soft. She exhibits no distension and no mass. There is no tenderness. There is no rebound and no guarding.  Genitourinary: No bleeding around the vagina. Vaginal discharge found.       Cervix Lg/closed. No cervical polyp noted. Uterus NL size and shape. No adnexal masses.  Neurological: She is alert and oriented to person, place, and time.  Skin: Skin is warm and dry. She is not diaphoretic.  Psychiatric: She has a normal mood and affect. Her behavior is normal. Judgment and thought content normal.    MAU Course  Procedures  Wet prep and GC/Chlamydia cultures done.  Results for orders placed during the hospital encounter of 12/24/11 (from the past 72 hour(s))  URINALYSIS, ROUTINE W REFLEX MICROSCOPIC     Status: Abnormal   Collection Time   12/24/11  1:45 PM      Component Value Range Comment   Color, Urine YELLOW  YELLOW     APPearance CLEAR  CLEAR     Specific Gravity, Urine 1.025  1.005 - 1.030     pH 6.5  5.0 - 8.0     Glucose, UA NEGATIVE  NEGATIVE (mg/dL)    Hgb urine dipstick SMALL (*) NEGATIVE     Bilirubin Urine NEGATIVE  NEGATIVE     Ketones, ur NEGATIVE  NEGATIVE (mg/dL)    Protein, ur NEGATIVE  NEGATIVE (mg/dL)    Urobilinogen, UA 4.0 (*) 0.0 - 1.0 (mg/dL)    Nitrite NEGATIVE  NEGATIVE     Leukocytes, UA NEGATIVE  NEGATIVE    URINE MICROSCOPIC-ADD ON     Status: Abnormal   Collection Time   12/24/11  1:45 PM      Component Value Range Comment   Squamous Epithelial / LPF FEW (*) RARE     WBC, UA 0-2  <3 (WBC/hpf)    RBC / HPF 7-10   <3 (RBC/hpf)    Bacteria, UA FEW (*) RARE     Urine-Other MUCOUS PRESENT     POCT PREGNANCY, URINE     Status: Normal   Collection Time   12/24/11  3:08 PM      Component Value Range Comment   Preg Test, Ur NEGATIVE  NEGATIVE    WET PREP, GENITAL     Status: Abnormal   Collection Time   12/24/11  3:21 PM      Component Value Range Comment   Yeast Wet Prep HPF POC NONE SEEN  NONE SEEN     Trich, Wet Prep NONE SEEN  NONE SEEN     Clue Cells Wet Prep HPF POC NONE SEEN  NONE SEEN     WBC, Wet Prep HPF POC MODERATE (*) NONE SEEN  MODERATE BACTERIA SEEN     Assessment and Plan  Vaginitis: discussed with pt at length. Will tx with azithromycin and await cultures.  Irregular vag bleeding: likely a secondary result of her recent Depo-provera injection. Will give Rx for anaprox ds and have her f/u with her PCP. Discussed diet, activity, risks, and precautions.  Clinton Gallant. Berman Grainger III, DrHSc, MPAS, PA-C  12/24/2011, 3:46 PM

## 2011-12-24 NOTE — MAU Provider Note (Signed)
Attestation of Attending Supervision of Advanced Practitioner: Evaluation and management procedures were performed by the PA/NP/CNM/OB Fellow under my supervision/collaboration. Chart reviewed, and agree with management and plan.  Esiquio Boesen, M.D. 12/24/2011 4:43 PM   

## 2011-12-24 NOTE — MAU Note (Signed)
Patient states she had her regular physical at [facility] on 1-30. Was told she had cervical polyps and to follow up with the [facility]. Patient states she was told they were not taking any new patients and she has some bleeding and abdominal pain on and off and wanted to be checked. No bleeding or pain today. Had Depo on 1-30.

## 2012-02-14 ENCOUNTER — Emergency Department (INDEPENDENT_AMBULATORY_CARE_PROVIDER_SITE_OTHER)
Admission: EM | Admit: 2012-02-14 | Discharge: 2012-02-14 | Disposition: A | Discharge status: Home / Self Care | Payer: Self-pay | Source: Home / Self Care | Attending: Emergency Medicine | Admitting: Emergency Medicine

## 2012-02-14 ENCOUNTER — Encounter (HOSPITAL_COMMUNITY): Payer: Self-pay

## 2012-02-14 DIAGNOSIS — J310 Chronic rhinitis: Secondary | ICD-10-CM

## 2012-02-14 DIAGNOSIS — J329 Chronic sinusitis, unspecified: Secondary | ICD-10-CM

## 2012-02-14 MED ORDER — FEXOFENADINE-PSEUDOEPHED ER 60-120 MG PO TB12: 1.0000 | ORAL_TABLET | Freq: Two times a day (BID) | ORAL | Status: DC

## 2012-02-14 MED ORDER — FEXOFENADINE-PSEUDOEPHED ER 60-120 MG PO TB12: 1.0000 | ORAL_TABLET | Freq: Two times a day (BID) | ORAL | Status: AC

## 2012-02-14 MED ORDER — FLUTICASONE PROPIONATE 50 MCG/ACT NA SUSP: 2.0000 | Freq: Every day | NASAL | Status: DC

## 2012-02-14 NOTE — Discharge Instructions (Signed)
Term symptoms worsen or persist more than 10 days    Sinusitis Sinuses are air pockets within the bones of your face. The growth of bacteria within a sinus leads to infection. The infection prevents the sinuses from draining. This infection is called sinusitis. SYMPTOMS  There will be different areas of pain depending on which sinuses have become infected.  The maxillary sinuses often produce pain beneath the eyes.   Frontal sinusitis may cause pain in the middle of the forehead and above the eyes.  Other problems (symptoms) include:  Toothaches.   Colored, pus-like (purulent) drainage from the nose.   Swelling, warmth, and tenderness over the sinus areas may be signs of infection.  TREATMENT  Sinusitis is most often determined by an exam.X-rays may be taken. If x-rays have been taken, make sure you obtain your results or find out how you are to obtain them. Your caregiver may give you medications (antibiotics). These are medications that will help kill the bacteria causing the infection. You may also be given a medication (decongestant) that helps to reduce sinus swelling.  HOME CARE INSTRUCTIONS   Only take over-the-counter or prescription medicines for pain, discomfort, or fever as directed by your caregiver.   Drink extra fluids. Fluids help thin the mucus so your sinuses can drain more easily.   Applying either moist heat or ice packs to the sinus areas may help relieve discomfort.   Use saline nasal sprays to help moisten your sinuses. The sprays can be found at your local drugstore.  SEEK IMMEDIATE MEDICAL CARE IF:  You have a fever.   You have increasing pain, severe headaches, or toothache.   You have nausea, vomiting, or drowsiness.   You develop unusual swelling around the face or trouble seeing.  MAKE SURE YOU:   Understand these instructions.   Will watch your condition.   Will get help right away if you are not doing well or get worse.  Document Released:  09/30/2005 Document Revised: 09/19/2011 Document Reviewed: 04/29/2007 [facility] Patient Information 2012 [place], [place].

## 2012-02-14 NOTE — ED Notes (Signed)
C/o sinus/head congestion, facial pain, yellow nasal secretions,slight cough and sore throat.  Sx started yesterday.

## 2012-02-14 NOTE — ED Provider Notes (Signed)
History     CSN: [id number]  Arrival date & time 02/14/12  0814   First MD Initiated Contact with Patient 02/14/12 [phone]      Chief Complaint  Patient presents with  . Facial Pain    (Consider location/radiation/quality/duration/timing/severity/associated sxs/prior treatment) HPI Comments: Patient presents today to urgent care complaining of sinus congestion marked stuffy and runny nose with some facial pressure has seen so yellow character to her nasal secretions and mild cough and discomfort when she swallows, symptoms started yesterday. Patient has not tried anything over-the-counter so far. Patient denies any fevers, shortness of breath the  The history is provided by the patient.    Past Medical History  Diagnosis Date  . Abnormal Pap smear     Past Surgical History  Procedure Date  . Leep 2007  . Laser ablation of the cervix 2003    Family History  Problem Relation Age of Onset  . Hypertension Mother     History  Substance Use Topics  . Smoking status: Never Smoker   . Smokeless tobacco: Not on file  . Alcohol Use: No    OB History    Grav Para Term Preterm Abortions TAB SAB Ect Mult Living   2 2 2       2       Review of Systems  Constitutional: Negative for fever, chills, activity change, appetite change and fatigue.  HENT: Positive for congestion, rhinorrhea and sinus pressure. Negative for sore throat, drooling and mouth sores.   Respiratory: Positive for cough. Negative for chest tightness and shortness of breath.   Cardiovascular: Negative for chest pain.  Skin: Negative for rash.    Allergies  Review of patient's allergies indicates no known allergies.  Home Medications   Current Outpatient Rx  Name Route Sig Dispense Refill  . MEDROXYPROGESTERONE ACETATE 150 MG/ML IM SUSP Intramuscular Inject 150 mg into the muscle every 3 (three) months. Last injection was in Jan 30th    . AZITHROMYCIN 250 MG PO TABS  Take both tablets as a single dose by  mouth. 2 tablet 0  . FEXOFENADINE-PSEUDOEPHED ER 60-120 MG PO TB12 Oral Take 1 tablet by mouth every 12 (twelve) hours. 30 tablet 0  . FLUTICASONE PROPIONATE 50 MCG/ACT NA SUSP Nasal Place 2 sprays into the nose daily. 16 g 2  . ADULT MULTIVITAMIN W/MINERALS CH Oral Take 1 tablet by mouth daily.    Marland Kitchen NAPROXEN SODIUM 550 MG PO TABS Oral Take 1 tablet (550 mg total) by mouth 2 (two) times daily with a meal. 30 tablet 0    BP 109/70  Pulse 88  Temp(Src) 98.3 F (36.8 C) (Oral)  Resp 16  SpO2 98%  Physical Exam  Nursing note and vitals reviewed. Constitutional: She appears well-developed and well-nourished.  HENT:  Head: Normocephalic.  Right Ear: Tympanic membrane normal.  Left Ear: Tympanic membrane normal.  Nose: Rhinorrhea present.  Mouth/Throat: Uvula is midline. Posterior oropharyngeal erythema present. No posterior oropharyngeal edema.  Eyes: Conjunctivae are normal.  Neck: Neck supple.  Cardiovascular: Normal rate.   No murmur heard. Pulmonary/Chest: Effort normal and breath sounds normal. She has no decreased breath sounds. She has no wheezes.  Lymphadenopathy:    She has no cervical adenopathy.  Skin: No rash noted.    ED Course  Procedures (including critical care time)  Labs Reviewed - No data to display No results found.   1. Sinusitis   2. Rhinitis       MDM  Patient  with sinus congestion upper respiratory symptoms for less than 24 hours. Symptomatic treatment encourage patient agree with treatment plan and followup care as necessary if her symptoms persist beyond 7-10 days was advised to return for recheck. Febrile and with marked rhinitis symptoms most likely related to allergies        Jimmie Molly, MD 02/14/12 1158

## 2012-07-11 ENCOUNTER — Emergency Department (HOSPITAL_COMMUNITY): Payer: Medicaid Other

## 2012-07-11 ENCOUNTER — Emergency Department (HOSPITAL_COMMUNITY)
Admission: EM | Admit: 2012-07-11 | Discharge: 2012-07-11 | Disposition: A | Discharge status: Home / Self Care | Payer: Medicaid Other | Attending: Emergency Medicine | Admitting: Emergency Medicine

## 2012-07-11 ENCOUNTER — Encounter (HOSPITAL_COMMUNITY): Payer: Self-pay

## 2012-07-11 DIAGNOSIS — Y9301 Activity, walking, marching and hiking: Secondary | ICD-10-CM | POA: Insufficient documentation

## 2012-07-11 DIAGNOSIS — S93401A Sprain of unspecified ligament of right ankle, initial encounter: Secondary | ICD-10-CM

## 2012-07-11 DIAGNOSIS — Y998 Other external cause status: Secondary | ICD-10-CM | POA: Insufficient documentation

## 2012-07-11 DIAGNOSIS — S93409A Sprain of unspecified ligament of unspecified ankle, initial encounter: Secondary | ICD-10-CM | POA: Insufficient documentation

## 2012-07-11 DIAGNOSIS — X500XXA Overexertion from strenuous movement or load, initial encounter: Secondary | ICD-10-CM | POA: Insufficient documentation

## 2012-07-11 MED ORDER — IBUPROFEN 600 MG PO TABS: 600.0000 mg | ORAL_TABLET | Freq: Four times a day (QID) | ORAL | Status: DC | PRN

## 2012-07-11 MED ORDER — TRAMADOL HCL 50 MG PO TABS: 50.0000 mg | ORAL_TABLET | Freq: Four times a day (QID) | ORAL | Status: DC | PRN

## 2012-07-11 MED ORDER — TRAMADOL HCL 50 MG PO TABS
50.0000 mg | ORAL_TABLET | Freq: Once | ORAL | Status: AC
  Administered 2012-07-11: 50 mg via ORAL
  Filled 2012-07-11: qty 1

## 2012-07-11 NOTE — Progress Notes (Signed)
Orthopedic Tech Progress Note Patient Details:  Margaret Chaney 05/10/1986 [id number]  Ortho Devices Type of Ortho Device: Crutches;ASO Ortho Device/Splint Location: right LE Ortho Device/Splint Interventions: Application Patient expressed she is able to use crutches. Gave verbal instructions.   Margaret Chaney 07/11/2012, 7:50 AM

## 2012-07-11 NOTE — ED Notes (Signed)
Pt missed a step and fell at 1900 complains of right ankle pain.

## 2012-07-11 NOTE — ED Provider Notes (Signed)
History     CSN: [id number]  Arrival date & time 07/11/12  2841   First MD Initiated Contact with Patient 07/11/12 0600      Chief Complaint  Patient presents with  . Ankle Pain    (Consider location/radiation/quality/duration/timing/severity/associated sxs/prior treatment) HPI Pt states she she twisted her R ankle while walking down steps last evening. She states she is unable to weight bear. No swelling.  Past Medical History  Diagnosis Date  . Abnormal Pap smear     Past Surgical History  Procedure Date  . Leep 2007  . Laser ablation of the cervix 2003    Family History  Problem Relation Age of Onset  . Hypertension Mother     History  Substance Use Topics  . Smoking status: Never Smoker   . Smokeless tobacco: Not on file  . Alcohol Use: No    OB History    Grav Para Term Preterm Abortions TAB SAB Ect Mult Living   2 2 2       2       Review of Systems  Cardiovascular: Negative for leg swelling.  Musculoskeletal: Positive for arthralgias.  Skin: Negative for wound.  Neurological: Negative for weakness and numbness.    Allergies  Review of patient's allergies indicates no known allergies.  Home Medications   Current Outpatient Rx  Name Route Sig Dispense Refill  . FEXOFENADINE-PSEUDOEPHED ER 60-120 MG PO TB12 Oral Take 1 tablet by mouth every 12 (twelve) hours. 30 tablet 0  . FLUTICASONE PROPIONATE 50 MCG/ACT NA SUSP Nasal Place 2 sprays into the nose daily. 16 g 2  . MELOXICAM 15 MG PO TABS Oral Take 15 mg by mouth daily.    Marland Kitchen PRESCRIPTION MEDICATION Oral Take 1 tablet by mouth daily. Birth control pill    . IBUPROFEN 600 MG PO TABS Oral Take 1 tablet (600 mg total) by mouth every 6 (six) hours as needed for pain. 30 tablet 0  . TRAMADOL HCL 50 MG PO TABS Oral Take 1 tablet (50 mg total) by mouth every 6 (six) hours as needed for pain. 15 tablet 0    BP 130/82  Pulse 91  Temp 98.7 F (37.1 C) (Oral)  Resp 18  SpO2 99%  Physical Exam   Constitutional: She is oriented to person, place, and time. She appears well-developed and well-nourished.  HENT:  Head: Atraumatic.  Neck: Normal range of motion. Neck supple.  Pulmonary/Chest: Effort normal.  Abdominal: Soft.  Musculoskeletal: She exhibits tenderness (TTP over med mal and distal to lateral mal. neuro vasc intact. no obvious injury or swelling. ).  Neurological: She is alert and oriented to person, place, and time.  Skin: Skin is warm and dry. No rash noted. No erythema. No pallor.    ED Course  Procedures (including critical care time)  Labs Reviewed - No data to display Dg Ankle Complete Right  07/11/2012  *RADIOLOGY REPORT*  Clinical Data: Fall and pain throughout the entire ankle.  RIGHT ANKLE - COMPLETE 3+ VIEW  Comparison: None.  Findings: Three views of the right ankle are negative for acute fracture or dislocation.  No gross soft tissue abnormality.  Normal alignment of the ankle.  IMPRESSION: No acute findings.   Original Report Authenticated By: Richarda Overlie, M.D.      1. Right ankle sprain       MDM          Loren Racer, MD 07/11/12 [phone]

## 2012-08-04 ENCOUNTER — Other Ambulatory Visit (HOSPITAL_COMMUNITY): Payer: Self-pay | Admitting: Family Medicine

## 2012-08-04 DIAGNOSIS — E0590 Thyrotoxicosis, unspecified without thyrotoxic crisis or storm: Secondary | ICD-10-CM

## 2012-08-17 ENCOUNTER — Encounter (HOSPITAL_COMMUNITY)
Admission: RE | Admit: 2012-08-17 | Discharge: 2012-08-17 | Disposition: A | Discharge status: Home / Self Care | Payer: Medicaid Other | Source: Ambulatory Visit | Attending: Family Medicine | Admitting: Family Medicine

## 2012-08-17 DIAGNOSIS — E0590 Thyrotoxicosis, unspecified without thyrotoxic crisis or storm: Secondary | ICD-10-CM | POA: Insufficient documentation

## 2012-08-17 MED ORDER — SODIUM IODIDE I 131 CAPSULE
5.9000 | Freq: Once | INTRAVENOUS | Status: AC | PRN
  Administered 2012-08-17: 5.9 via ORAL

## 2012-08-18 ENCOUNTER — Encounter (HOSPITAL_COMMUNITY)
Admission: RE | Admit: 2012-08-18 | Discharge: 2012-08-18 | Disposition: A | Discharge status: Home / Self Care | Payer: Medicaid Other | Source: Ambulatory Visit | Attending: Family Medicine | Admitting: Family Medicine

## 2012-08-18 MED ORDER — SODIUM PERTECHNETATE TC 99M INJECTION
10.0000 | Freq: Once | INTRAVENOUS | Status: AC | PRN
  Administered 2012-08-18: 10 via INTRAVENOUS

## 2012-08-18 MED ORDER — SODIUM IODIDE I 131 CAPSULE
5.9000 | Freq: Once | INTRAVENOUS | Status: AC | PRN
  Administered 2012-08-17: 5.9 via ORAL

## 2012-09-02 ENCOUNTER — Ambulatory Visit: Payer: Medicaid Other

## 2013-03-15 ENCOUNTER — Ambulatory Visit: Payer: Medicaid Other | Attending: Sports Medicine | Admitting: Physical Therapy

## 2013-03-15 DIAGNOSIS — IMO0001 Reserved for inherently not codable concepts without codable children: Secondary | ICD-10-CM | POA: Insufficient documentation

## 2013-03-15 DIAGNOSIS — M25569 Pain in unspecified knee: Secondary | ICD-10-CM | POA: Insufficient documentation

## 2013-03-15 DIAGNOSIS — R609 Edema, unspecified: Secondary | ICD-10-CM | POA: Insufficient documentation

## 2013-03-23 ENCOUNTER — Emergency Department (HOSPITAL_COMMUNITY): Payer: Medicaid Other

## 2013-03-23 ENCOUNTER — Ambulatory Visit: Payer: Medicaid Other | Admitting: Physical Therapy

## 2013-03-23 ENCOUNTER — Emergency Department (HOSPITAL_COMMUNITY)
Admission: EM | Admit: 2013-03-23 | Discharge: 2013-03-23 | Disposition: A | Discharge status: Home / Self Care | Payer: Medicaid Other | Attending: Emergency Medicine | Admitting: Emergency Medicine

## 2013-03-23 ENCOUNTER — Encounter (HOSPITAL_COMMUNITY): Payer: Self-pay | Admitting: Emergency Medicine

## 2013-03-23 DIAGNOSIS — Z79899 Other long term (current) drug therapy: Secondary | ICD-10-CM | POA: Insufficient documentation

## 2013-03-23 DIAGNOSIS — W868XXA Exposure to other electric current, initial encounter: Secondary | ICD-10-CM | POA: Insufficient documentation

## 2013-03-23 DIAGNOSIS — Z8742 Personal history of other diseases of the female genital tract: Secondary | ICD-10-CM | POA: Insufficient documentation

## 2013-03-23 DIAGNOSIS — Y939 Activity, unspecified: Secondary | ICD-10-CM | POA: Insufficient documentation

## 2013-03-23 DIAGNOSIS — T754XXA Electrocution, initial encounter: Secondary | ICD-10-CM | POA: Insufficient documentation

## 2013-03-23 DIAGNOSIS — Z3202 Encounter for pregnancy test, result negative: Secondary | ICD-10-CM | POA: Insufficient documentation

## 2013-03-23 DIAGNOSIS — R209 Unspecified disturbances of skin sensation: Secondary | ICD-10-CM | POA: Insufficient documentation

## 2013-03-23 DIAGNOSIS — Y92009 Unspecified place in unspecified non-institutional (private) residence as the place of occurrence of the external cause: Secondary | ICD-10-CM | POA: Insufficient documentation

## 2013-03-23 LAB — BASIC METABOLIC PANEL
BUN: 11 mg/dL (ref 6–23)
Calcium: 9.8 mg/dL (ref 8.4–10.5)
Creatinine, Ser: 0.8 mg/dL (ref 0.50–1.10)
GFR calc Af Amer: >90 mL/min (ref >90)
GFR calc non Af Amer: >90 mL/min (ref >90)

## 2013-03-23 LAB — CK: Total CK: 53 U/L (ref 7–177)

## 2013-03-23 LAB — URINALYSIS, ROUTINE W REFLEX MICROSCOPIC
Ketones, ur: 15 mg/dL — AB
Nitrite: POSITIVE — AB
Protein, ur: 100 mg/dL — AB
Urobilinogen, UA: 1 mg/dL (ref 0.0–1.0)
pH: 6 (ref 5.0–8.0)

## 2013-03-23 LAB — PREGNANCY, URINE: Preg Test, Ur: NEGATIVE

## 2013-03-23 LAB — URINE MICROSCOPIC-ADD ON

## 2013-03-23 NOTE — ED Notes (Signed)
Pt here with electric shock from powerline while attempting to get child off powerline; pt c/o right wrist pain
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
## 2013-03-30 ENCOUNTER — Encounter: Payer: Medicaid Other | Admitting: Physical Therapy

## 2013-11-03 ENCOUNTER — Emergency Department (HOSPITAL_COMMUNITY)
Admission: EM | Admit: 2013-11-03 | Discharge: 2013-11-04 | Disposition: A | Discharge status: Home / Self Care | Payer: Medicaid Other | Attending: Emergency Medicine | Admitting: Emergency Medicine

## 2013-11-03 ENCOUNTER — Encounter (HOSPITAL_COMMUNITY): Payer: Self-pay | Admitting: Emergency Medicine

## 2013-11-03 DIAGNOSIS — R51 Headache: Secondary | ICD-10-CM | POA: Insufficient documentation

## 2013-11-03 DIAGNOSIS — R112 Nausea with vomiting, unspecified: Secondary | ICD-10-CM

## 2013-11-03 DIAGNOSIS — Z79899 Other long term (current) drug therapy: Secondary | ICD-10-CM | POA: Insufficient documentation

## 2013-11-03 DIAGNOSIS — R519 Headache, unspecified: Secondary | ICD-10-CM

## 2013-11-03 LAB — CBC
HCT: 35.6 % — ABNORMAL LOW (ref 36.0–46.0)
HEMOGLOBIN: 12.5 g/dL (ref 12.0–15.0)
MCH: 31.1 pg (ref 26.0–34.0)
MCHC: 35.1 g/dL (ref 30.0–36.0)
MCV: 88.6 fL (ref 78.0–100.0)
PLATELETS: 226 10*3/uL (ref 150–400)
RBC: 4.02 MIL/uL (ref 3.87–5.11)
RDW: 12.1 % (ref 11.5–15.5)
WBC: 3.7 10*3/uL — ABNORMAL LOW (ref 4.0–10.5)

## 2013-11-03 LAB — BASIC METABOLIC PANEL
BUN: 11 mg/dL (ref 6–23)
CALCIUM: 9.1 mg/dL (ref 8.4–10.5)
CO2: 28 mEq/L (ref 19–32)
Chloride: 103 mEq/L (ref 96–112)
Creatinine, Ser: 0.66 mg/dL (ref 0.50–1.10)
GFR calc Af Amer: >90 mL/min (ref >90)
GLUCOSE: 95 mg/dL (ref 70–99)
Potassium: 3.6 mEq/L — ABNORMAL LOW (ref 3.7–5.3)
SODIUM: 143 meq/L (ref 137–147)

## 2013-11-03 MED ORDER — KETOROLAC TROMETHAMINE 30 MG/ML IJ SOLN
30.0000 mg | Freq: Once | INTRAMUSCULAR | Status: AC
  Administered 2013-11-04: 30 mg via INTRAVENOUS
  Filled 2013-11-03: qty 1

## 2013-11-03 MED ORDER — MORPHINE SULFATE 4 MG/ML IJ SOLN
4.0000 mg | Freq: Once | INTRAMUSCULAR | Status: AC
  Administered 2013-11-04: 4 mg via INTRAVENOUS
  Filled 2013-11-03: qty 1

## 2013-11-03 MED ORDER — SODIUM CHLORIDE 0.9 % IV BOLUS (SEPSIS)
1000.0000 mL | Freq: Once | INTRAVENOUS | Status: AC
  Administered 2013-11-04: 1000 mL via INTRAVENOUS

## 2013-11-03 MED ORDER — ACETAMINOPHEN 325 MG PO TABS
650.0000 mg | ORAL_TABLET | Freq: Once | ORAL | Status: AC
  Administered 2013-11-03: 650 mg via ORAL
  Filled 2013-11-03: qty 2

## 2013-11-03 MED ORDER — ONDANSETRON 4 MG PO TBDP
4.0000 mg | ORAL_TABLET | Freq: Once | ORAL | Status: AC
  Administered 2013-11-03: 4 mg via ORAL
  Filled 2013-11-03: qty 1

## 2013-11-03 NOTE — ED Notes (Signed)
Pt states HA since this morning with photophobia. No neuro deficits noted.

## 2013-11-03 NOTE — ED Provider Notes (Signed)
CSN: [id number]     Arrival date & time 11/03/13  1925 History   First MD Initiated Contact with Patient 11/03/13 2319     Chief Complaint  Patient presents with  . Headache   (Consider location/radiation/quality/duration/timing/severity/associated sxs/prior Treatment) HPI This is a 28 year old woman with history of headaches. She presents with complaint of headache which began this morning. Her pain as aching, present throughout her head. She denies any recent head trauma. Denies fever. Denies any focal neurologic deficits. Her by mouth intake has been good. She has been nauseated and has vomited once. She is photophobic.  She describes her pain as 7 on a 0-to-10 scale. Is not the worse headache of her life. Onset was gradual.   Past Medical History  Diagnosis Date  . Abnormal Pap smear    Past Surgical History  Procedure Laterality Date  . Leep  2007  . Laser ablation of the cervix  2003   Family History  Problem Relation Age of Onset  . Hypertension Mother    History  Substance Use Topics  . Smoking status: Never Smoker   . Smokeless tobacco: Never Used  . Alcohol Use: No   OB History   Grav Para Term Preterm Abortions TAB SAB Ect Mult Living   2 2 2       2      Review of Systems Ten point review of symptoms performed and is negative with the exception of symptoms noted above.   Allergies  Review of patient's allergies indicates no known allergies.  Home Medications   Current Outpatient Rx  Name  Route  Sig  Dispense  Refill  . DULoxetine (CYMBALTA) 20 MG capsule   Oral   Take 20 mg by mouth daily.         Suzzanne Cloud. Norgestimate-Eth Estradiol (MONONESSA PO)   Oral   Take 1 tablet by mouth daily.          BP 152/87  Pulse 58  Temp(Src) 98.2 F (36.8 C) (Oral)  Resp 18  SpO2 100%  LMP 11/02/2013 Physical Exam Gen: well developed and well nourished appearing Head: NCAT Eyes: PERL, EOMI, normal funduscopic exam Nose: no epistaixis or  rhinorrhea Mouth/throat: mucosa is moist and pink Neck: supple, no stridor Lungs: CTA B, no wheezing, rhonchi or rales CV: RRR, no murmur, extremities appear well perfused.  Abd: soft, notender, nondistended Back: no ttp, no cva ttp Skin: warm and dry Ext: normal to inspection, no dependent edema Neuro: CN ii-xii grossly intact, motor strength 5 over 5 both arms the no focal deficits Psyche; normal affect,  calm and cooperative.  ED Course  Procedures (including critical care time) Labs Review Results for orders placed during the hospital encounter of 11/03/13 (from the past 24 hour(s))  CBC     Status: Abnormal   Collection Time    11/03/13  7:34 PM      Result Value Range   WBC 3.7 (*) 4.0 - 10.5 K/uL   RBC 4.02  3.87 - 5.11 MIL/uL   Hemoglobin 12.5  12.0 - 15.0 g/dL   HCT 96.235.6 (*) 95.236.0 - 84.146.0 %   MCV 88.6  78.0 - 100.0 fL   MCH 31.1  26.0 - 34.0 pg   MCHC 35.1  30.0 - 36.0 g/dL   RDW 32.412.1  40.111.5 - 02.715.5 %   Platelets 226  150 - 400 K/uL  BASIC METABOLIC PANEL     Status: Abnormal   Collection Time    11/03/13  7:34 PM      Result Value Range   Sodium 143  137 - 147 mEq/L   Potassium 3.6 (*) 3.7 - 5.3 mEq/L   Chloride 103  96 - 112 mEq/L   CO2 28  19 - 32 mEq/L   Glucose, Bld 95  70 - 99 mg/dL   BUN 11  6 - 23 mg/dL   Creatinine, Ser 1.61  0.50 - 1.10 mg/dL   Calcium 9.1  8.4 - 09.6 mg/dL   GFR calc non Af Amer >90  >90 mL/min   GFR calc Af Amer >90  >90 mL/min     MDM  Patient with headache. No clinical suspicious for meningitis nor SAH. Normal neuro exam.  Plan is for symptomatic management then discharge home with outpatient f/u prn    Brandt Loosen, MD 11/03/13 2349

## 2013-11-04 NOTE — ED Notes (Signed)
Pt given d/c instructions and verbalized understanding. NAD at this time. Pt riding home with mother.

## 2014-01-30 ENCOUNTER — Emergency Department (HOSPITAL_COMMUNITY)
Admission: EM | Admit: 2014-01-30 | Discharge: 2014-01-30 | Disposition: A | Discharge status: Home / Self Care | Payer: 59 | Attending: Emergency Medicine | Admitting: Emergency Medicine

## 2014-01-30 ENCOUNTER — Encounter (HOSPITAL_COMMUNITY): Payer: Self-pay | Admitting: Emergency Medicine

## 2014-01-30 DIAGNOSIS — R209 Unspecified disturbances of skin sensation: Secondary | ICD-10-CM | POA: Diagnosis not present

## 2014-01-30 DIAGNOSIS — Z79899 Other long term (current) drug therapy: Secondary | ICD-10-CM | POA: Diagnosis not present

## 2014-01-30 DIAGNOSIS — R51 Headache: Secondary | ICD-10-CM | POA: Diagnosis present

## 2014-01-30 DIAGNOSIS — G43909 Migraine, unspecified, not intractable, without status migrainosus: Secondary | ICD-10-CM | POA: Insufficient documentation

## 2014-01-30 DIAGNOSIS — R519 Headache, unspecified: Secondary | ICD-10-CM

## 2014-01-30 HISTORY — DX: Migraine, unspecified, not intractable, without status migrainosus: G43.909

## 2014-01-30 MED ORDER — SODIUM CHLORIDE 0.9 % IV BOLUS (SEPSIS)
500.0000 mL | Freq: Once | INTRAVENOUS | Status: AC
  Administered 2014-01-30: 500 mL via INTRAVENOUS

## 2014-01-30 MED ORDER — KETOROLAC TROMETHAMINE 30 MG/ML IJ SOLN
30.0000 mg | Freq: Once | INTRAMUSCULAR | Status: AC
  Administered 2014-01-30: 30 mg via INTRAVENOUS
  Filled 2014-01-30: qty 1

## 2014-01-30 NOTE — ED Provider Notes (Signed)
Medical screening examination/treatment/procedure(s) were performed by non-physician practitioner and as supervising physician I was immediately available for consultation/collaboration.   EKG Interpretation None       Merci Walthers R. Eragon Hammond, MD 01/30/14 2043 

## 2014-01-30 NOTE — ED Provider Notes (Signed)
CSN: [id number]     Arrival date & time 01/30/14  1004 History   First MD Initiated Contact with Patient 01/30/14 1106     Chief Complaint  Patient presents with  . Headache     (Consider location/radiation/quality/duration/timing/severity/associated sxs/prior Treatment) HPI Comments: Patient with history of migraine headaches presents with headache that began yesterday that is the same as previous headaches. Pain is frontal, left worse than right, throbbing. She does not have associated fever, neck pain, head injury, weakness in arms or legs. She c/o tingling in L palm that just started on arrival to ED. She has photophobia and phonophobia. She took Imitrex yesterday without relief. No other treatments prior to arrival. The onset of this condition was acute. The course is constant. Alleviating factors: none.    The history is provided by the patient and medical records.    Past Medical History  Diagnosis Date  . Abnormal Pap smear   . Migraine    Past Surgical History  Procedure Laterality Date  . Leep  2007  . Laser ablation of the cervix  2003   Family History  Problem Relation Age of Onset  . Hypertension Mother    History  Substance Use Topics  . Smoking status: Never Smoker   . Smokeless tobacco: Never Used  . Alcohol Use: No   OB History   Grav Para Term Preterm Abortions TAB SAB Ect Mult Living   2 2 2       2      Review of Systems  Constitutional: Negative for fever.  HENT: Negative for congestion, dental problem, rhinorrhea and sinus pressure.   Eyes: Positive for photophobia. Negative for discharge, redness and visual disturbance.  Respiratory: Negative for shortness of breath.   Cardiovascular: Negative for chest pain.  Gastrointestinal: Negative for nausea and vomiting.  Musculoskeletal: Negative for gait problem, neck pain and neck stiffness.  Skin: Negative for rash.  Neurological: Positive for headaches. Negative for syncope, speech difficulty,  weakness, light-headedness and numbness.  Psychiatric/Behavioral: Negative for confusion.   Allergies  Review of patient's allergies indicates no known allergies.  Home Medications   Prior to Admission medications   Medication Sig Start Date End Date Taking? Authorizing Provider  Norgestimate-Eth Estradiol (MONONESSA PO) Take 1 tablet by mouth daily.   Yes Historical Provider, MD  SUMAtriptan (IMITREX) 50 MG tablet Take 50 mg by mouth every 2 (two) hours as needed for migraine.  11/10/13 11/10/14 Yes Historical Provider, MD   BP 137/85  Pulse 56  Temp(Src) 97.9 F (36.6 C) (Oral)  Resp 18  SpO2 100%  Physical Exam  Nursing note and vitals reviewed. Constitutional: She is oriented to person, place, and time. She appears well-developed and well-nourished.  HENT:  Head: Normocephalic and atraumatic.  Right Ear: Tympanic membrane, external ear and ear canal normal.  Left Ear: Tympanic membrane, external ear and ear canal normal.  Nose: Nose normal.  Mouth/Throat: Uvula is midline, oropharynx is clear and moist and mucous membranes are normal.  Eyes: Conjunctivae, EOM and lids are normal. Pupils are equal, round, and reactive to light. Right eye exhibits no nystagmus. Left eye exhibits no nystagmus.  Neck: Normal range of motion. Neck supple.  Cardiovascular: Normal rate and regular rhythm.   Pulmonary/Chest: Effort normal and breath sounds normal.  Abdominal: Soft. There is no tenderness.  Musculoskeletal:       Cervical back: She exhibits normal range of motion, no tenderness and no bony tenderness.  Neurological: She is alert and  oriented to person, place, and time. She has normal strength and normal reflexes. A sensory deficit (tingling L palm) is present. No cranial nerve deficit. She displays a negative Romberg sign. Coordination and gait normal. GCS eye subscore is 4. GCS verbal subscore is 5. GCS motor subscore is 6.  Skin: Skin is warm and dry.  Psychiatric: She has a normal  mood and affect.    ED Course  Procedures (including critical care time) Labs Review Labs Reviewed - No data to display  Imaging Review No results found.   EKG Interpretation None      11:48 AM Patient seen and examined. Work-up initiated. Medications ordered.   Vital signs reviewed and are as follows: Filed Vitals:   01/30/14 1016  BP: 137/85  Pulse: 56  Temp: 97.9 F (36.6 C)  Resp: 18   1:04 PM patient feels better after Toradol and is requesting discharge.  Encouraged PCP followup as needed. Patient counseled to return if they have weakness in their arms or legs, slurred speech, trouble walking or talking, confusion, trouble with their balance, or if they have any other concerns. Patient verbalizes understanding and agrees with plan.     MDM   Final diagnoses:  Headache   Patient with HA, same as previous. Patient without high-risk features of headache including: sudden onset/thunderclap HA, no similar headache in past, altered mental status, accompanying seizure, headache with exertion, age > 5850, history of immunocompromise, neck or shoulder pain, fever, use of anticoagulation, family history of spontaneous SAH, concomitant drug use, toxic exposure.   Patient has a normal complete neurological exam, normal vital signs, normal level of consciousness, no signs of meningismus, is well-appearing/non-toxic appearing, no signs of trauma, no pain over the temporal arteries.   Imaging with CT/MRI not indicated given history and physical exam findings.   No dangerous or life-threatening conditions suspected or identified by history, physical exam, and by work-up. No indications for hospitalization identified.      Renne CriglerJoshua Loleta Frommelt, PA-C 01/30/14 [phone]

## 2014-01-30 NOTE — ED Notes (Signed)
Pt presents to department for evaluation of headache. States frequent migraines. 8/10 pain upon arrival. Also states photosensitivity. No nausea/vomiting. Pt is alert and oriented x4. No neurological deficits noted.

## 2014-01-30 NOTE — Discharge Instructions (Signed)
Please read and follow all provided instructions.  Your diagnoses today include:  1. Headache     Tests performed today include:  Vital signs. See below for your results today.   Medications:  In the Emergency Department you received:  Toradol - NSAID medication similar to ibuprofen  Take any prescribed medications only as directed.  Additional information:  Follow any educational materials contained in this packet.  You are having a headache. No specific cause was found today for your headache. It may have been a migraine or other cause of headache. Stress, anxiety, fatigue, and depression are common triggers for headaches.   Your headache today does not appear to be life-threatening or require hospitalization, but often the exact cause of headaches is not determined in the emergency department. Therefore, follow-up with your doctor is very important to find out what may have caused your headache and whether or not you need any further diagnostic testing or treatment.   Sometimes headaches can appear benign (not harmful), but then more serious symptoms can develop which should prompt an immediate re-evaluation by your doctor or the emergency department.  BE VERY CAREFUL not to take multiple medicines containing Tylenol (also called acetaminophen). Doing so can lead to an overdose which can damage your liver and cause liver failure and possibly death.   Follow-up instructions: Please follow-up with your primary care provider in the next 3 days for further evaluation of your symptoms. If you do not have a primary care doctor -- see below for referral information.   Return instructions:   Please return to the Emergency Department if you experience worsening symptoms.  Return if the medications do not resolve your headache, if it recurs, or if you have multiple episodes of vomiting or cannot keep down fluids.  Return if you have a change from the usual headache.  RETURN  IMMEDIATELY IF you:  Develop a sudden, severe headache  Develop confusion or become poorly responsive or faint  Develop a fever above 100.72F or problem breathing  Have a change in speech, vision, swallowing, or understanding  Develop new weakness, numbness, tingling, incoordination in your arms or legs  Have a seizure  Please return if you have any other emergent concerns.  Additional Information:  Your vital signs today were: BP 146/91   Pulse 60   Temp(Src) 97.9 F (36.6 C) (Oral)   Resp 18   SpO2 99% If your blood pressure (BP) was elevated above 135/85 this visit, please have this repeated by your doctor within one month. --------------

## 2014-06-07 ENCOUNTER — Other Ambulatory Visit: Payer: Self-pay | Admitting: Family Medicine

## 2014-06-13 ENCOUNTER — Other Ambulatory Visit: Payer: Self-pay | Admitting: Family Medicine

## 2014-06-18 ENCOUNTER — Other Ambulatory Visit: Payer: Self-pay | Admitting: Family Medicine

## 2014-08-15 ENCOUNTER — Encounter (HOSPITAL_COMMUNITY): Payer: Self-pay | Admitting: Emergency Medicine

## 2015-12-08 ENCOUNTER — Emergency Department (HOSPITAL_COMMUNITY)
Admission: EM | Admit: 2015-12-08 | Discharge: 2015-12-08 | Disposition: A | Discharge status: Home / Self Care | Payer: Medicaid Other | Attending: Emergency Medicine | Admitting: Emergency Medicine

## 2015-12-08 ENCOUNTER — Encounter (HOSPITAL_COMMUNITY): Payer: Self-pay | Admitting: Emergency Medicine

## 2015-12-08 DIAGNOSIS — R51 Headache: Secondary | ICD-10-CM | POA: Insufficient documentation

## 2015-12-08 NOTE — ED Notes (Signed)
Unable to find pt in waiting room to recheck vitals at 1355.

## 2015-12-08 NOTE — ED Notes (Signed)
Per staff pt called repeatedly without response.
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
## 2015-12-20 ENCOUNTER — Emergency Department (HOSPITAL_COMMUNITY): Payer: BLUE CROSS/BLUE SHIELD

## 2015-12-20 ENCOUNTER — Encounter (HOSPITAL_COMMUNITY): Payer: Self-pay | Admitting: Emergency Medicine

## 2015-12-20 ENCOUNTER — Emergency Department (HOSPITAL_COMMUNITY)
Admission: EM | Admit: 2015-12-20 | Discharge: 2015-12-20 | Disposition: A | Discharge status: Home / Self Care | Payer: BLUE CROSS/BLUE SHIELD | Attending: Emergency Medicine | Admitting: Emergency Medicine

## 2015-12-20 DIAGNOSIS — Q670 Congenital facial asymmetry: Secondary | ICD-10-CM | POA: Diagnosis not present

## 2015-12-20 DIAGNOSIS — R531 Weakness: Secondary | ICD-10-CM | POA: Diagnosis not present

## 2015-12-20 DIAGNOSIS — G510 Bell's palsy: Secondary | ICD-10-CM | POA: Insufficient documentation

## 2015-12-20 DIAGNOSIS — Z7952 Long term (current) use of systemic steroids: Secondary | ICD-10-CM | POA: Insufficient documentation

## 2015-12-20 DIAGNOSIS — Z3202 Encounter for pregnancy test, result negative: Secondary | ICD-10-CM | POA: Insufficient documentation

## 2015-12-20 DIAGNOSIS — R200 Anesthesia of skin: Secondary | ICD-10-CM | POA: Diagnosis present

## 2015-12-20 DIAGNOSIS — Z8679 Personal history of other diseases of the circulatory system: Secondary | ICD-10-CM | POA: Diagnosis not present

## 2015-12-20 DIAGNOSIS — Z79899 Other long term (current) drug therapy: Secondary | ICD-10-CM | POA: Diagnosis not present

## 2015-12-20 LAB — BASIC METABOLIC PANEL
Anion gap: 13 (ref 5–15)
BUN: 11 mg/dL (ref 6–20)
CHLORIDE: 102 mmol/L (ref 101–111)
CO2: 24 mmol/L (ref 22–32)
Calcium: 9.3 mg/dL (ref 8.9–10.3)
Creatinine, Ser: 0.74 mg/dL (ref 0.44–1.00)
GFR calc Af Amer: >60 mL/min (ref >60)
GLUCOSE: 96 mg/dL (ref 65–99)
POTASSIUM: 3.8 mmol/L (ref 3.5–5.1)
Sodium: 139 mmol/L (ref 135–145)

## 2015-12-20 LAB — CBC WITH DIFFERENTIAL/PLATELET
Basophils Absolute: 0 10*3/uL (ref 0.0–0.1)
Basophils Relative: 1 %
EOS PCT: 3 %
Eosinophils Absolute: 0.1 10*3/uL (ref 0.0–0.7)
HEMATOCRIT: 38.4 % (ref 36.0–46.0)
Hemoglobin: 13.5 g/dL (ref 12.0–15.0)
LYMPHS ABS: 1.6 10*3/uL (ref 0.7–4.0)
LYMPHS PCT: 49 %
MCH: 30.8 pg (ref 26.0–34.0)
MCHC: 35.2 g/dL (ref 30.0–36.0)
MCV: 87.7 fL (ref 78.0–100.0)
Monocytes Absolute: 0.3 10*3/uL (ref 0.1–1.0)
Monocytes Relative: 8 %
NEUTROS ABS: 1.2 10*3/uL — AB (ref 1.7–7.7)
NEUTROS PCT: 39 %
PLATELETS: 208 10*3/uL (ref 150–400)
RBC: 4.38 MIL/uL (ref 3.87–5.11)
RDW: 11.6 % (ref 11.5–15.5)
WBC: 3.2 10*3/uL — ABNORMAL LOW (ref 4.0–10.5)

## 2015-12-20 LAB — I-STAT BETA HCG BLOOD, ED (MC, WL, AP ONLY): I-stat hCG, quantitative: <5 m[IU]/mL (ref <5)

## 2015-12-20 MED ORDER — PREDNISONE 20 MG PO TABS
60.0000 mg | ORAL_TABLET | Freq: Once | ORAL | Status: AC
  Administered 2015-12-20: 60 mg via ORAL
  Filled 2015-12-20: qty 3

## 2015-12-20 MED ORDER — VALACYCLOVIR HCL 1 G PO TABS: 1000.0000 mg | ORAL_TABLET | Freq: Three times a day (TID) | ORAL | Status: AC

## 2015-12-20 MED ORDER — PREDNISONE 20 MG PO TABS: 20.0000 mg | ORAL_TABLET | Freq: Every day | ORAL | Status: DC

## 2015-12-20 MED ORDER — ARTIFICIAL TEARS OP OINT
1.0000 "application " | TOPICAL_OINTMENT | Freq: Once | OPHTHALMIC | Status: AC
  Administered 2015-12-20: 1 via OPHTHALMIC
  Filled 2015-12-20: qty 3.5

## 2015-12-20 MED ORDER — ARTIFICIAL TEARS OP OINT: TOPICAL_OINTMENT | Freq: Every evening | OPHTHALMIC | Status: DC | PRN

## 2015-12-20 NOTE — Discharge Instructions (Signed)
Wear the patch on your eye at night after placing drops in your eye. Call [facility] to make an outpatient appointment for next week.  Bell Palsy Bell palsy is a condition in which the muscles on one side of the face become paralyzed. This often causes one side of the face to droop. It is a common condition and most people recover completely. RISK FACTORS Risk factors for Bell palsy include:  Pregnancy.  Diabetes.  An infection by a virus, such as infections that cause cold sores. CAUSES  Bell palsy is caused by damage to or inflammation of a nerve in your face. It is unclear why this happens, but an infection by a virus may lead to it. Most of the time the reason it happens is unknown. SIGNS AND SYMPTOMS  Symptoms can range from mild to severe and can take place over a number of hours. Symptoms may include:  Being unable to:  Raise one or both eyebrows.  Close one or both eyes.  Feel parts of your face (facial numbness).  Drooping of the eyelid and corner of the mouth.  Weakness in the face.  Paralysis of half your face.  Loss of taste.  Sensitivity to loud noises.  Difficulty chewing.  Tearing up of the affected eye.  Dryness in the affected eye.  Drooling.  Pain behind one ear. DIAGNOSIS  Diagnosis of Bell palsy may include:  A medical history and physical exam.  An MRI.  A CT scan.  Electromyography (EMG). This is a test that checks how your nerves are working. TREATMENT  Treatment may include antiviral medicine to help shorten the length of the condition. Sometimes treatment is not needed and the symptoms go away on their own. HOME CARE INSTRUCTIONS   Take medicines only as directed by your health care provider.  Do facial massages and exercises as directed by your health care provider.  If your eye is affected:  Use moisturizing eye drops to prevent drying of your eye as directed by your health care provider.  Protect your  eye as directed by your health care provider. SEEK MEDICAL CARE IF:  Your symptoms do not get better or get worse.  You are drooling.  Your eye is red, irritated, or hurts. SEEK IMMEDIATE MEDICAL CARE IF:   Another part of your body feels weak or numb.  You have difficulty swallowing.  You have a fever along with symptoms of Bell palsy.  You develop neck pain. MAKE SURE YOU:   Understand these instructions.  Will watch your condition.  Will get help right away if you are not doing well or get worse.   This information is not intended to replace advice given to you by your health care provider. Make sure you discuss any questions you have with your health care provider.   Document Released: 09/30/2005 Document Revised: 06/21/2015 Document Reviewed: 01/07/2014 Elsevier Interactive Patient Education [employer]2016 [employer]

## 2015-12-20 NOTE — ED Notes (Signed)
MD at bedside. 

## 2015-12-20 NOTE — ED Notes (Addendum)
Pt. reports left facial numbness onset last night ( 7 pm ) , denies facial injury , mild left facial asymmetry when smiling , no slurred speech , equal strong grips /no arm drift. Alert and oriented/respirations unlabored. Pt. added mild left upper arm tingling. Pt. seen by Dr. Kirtland BouchardK. Ward ( EDP ) orders given to triage nurse.

## 2015-12-20 NOTE — ED Provider Notes (Signed)
CSN: [id number]     Arrival date & time 12/20/15  1191 History   First MD Initiated Contact with Patient 12/20/15 [phone]     Chief Complaint  Patient presents with  . Numbness      HPI  She presents evaluation of left face abnormality. She states that yesterday afternoon she started no similar eye was dry at work. Then she noticed that it was tearing a little bit more. When she got home she states she felt strange and left side of her face and felt like her eye wasn't closing. She felt like her left side of her face was was weak. She states she has to use more the right side of her mouth to drink or otherwise she drools.  . Some pressure behind her left ear. No changes in hearing. States "everything tastes bitter".  Past Medical History  Diagnosis Date  . Abnormal Pap smear   . Migraine    Past Surgical History  Procedure Laterality Date  . Leep  2007  . Laser ablation of the cervix  2003   Family History  Problem Relation Age of Onset  . Hypertension Mother    Social History  Substance Use Topics  . Smoking status: Never Smoker   . Smokeless tobacco: Never Used  . Alcohol Use: No   OB History    Gravida Para Term Preterm AB TAB SAB Ectopic Multiple Living   Review of Systems  Constitutional: Negative for fever, chills, diaphoresis, appetite change and fatigue.  HENT: Negative for mouth sores, sore throat and trouble swallowing.        Abnormal taste. No change in hearing. No numbness to the tongue. Difficulty coordinating her mouth to drink  Eyes: Negative for visual disturbance.  Respiratory: Negative for cough, chest tightness, shortness of breath and wheezing.   Cardiovascular: Negative for chest pain.  Gastrointestinal: Negative for nausea, vomiting, abdominal pain, diarrhea and abdominal distention.  Endocrine: Negative for polydipsia, polyphagia and polyuria.  Genitourinary: Negative for dysuria, frequency and hematuria.  Musculoskeletal:  Negative for gait problem.  Skin: Negative for color change, pallor and rash.  Neurological: Positive for facial asymmetry and weakness. Negative for dizziness, syncope, light-headedness and headaches.  Hematological: Does not bruise/bleed easily.  Psychiatric/Behavioral: Negative for behavioral problems and confusion.      Allergies  Review of patient's allergies indicates no known allergies.  Home Medications   Prior to Admission medications   Medication Sig Start Date End Date Taking? Authorizing Provider  artificial tears (LACRILUBE) OINT ophthalmic ointment Place into the left eye at bedtime as needed for dry eyes. 12/20/15   Rolland Porter, MD  Norgestimate-Eth Estradiol (MONONESSA PO) Take 1 tablet by mouth daily.    Historical Provider, MD  predniSONE (DELTASONE) 20 MG tablet Take 1 tablet (20 mg total) by mouth daily with breakfast. 12/20/15   Rolland Porter, MD  valACYclovir (VALTREX) 1000 MG tablet Take 1 tablet (1,000 mg total) by mouth 3 (three) times daily. 12/20/15 01/03/16  Rolland Porter, MD   BP 131/89 mmHg  Pulse 85  Temp(Src) 98.7 F (37.1 C) (Oral)  Resp 14  Ht  (1.626 m)  Wt 124 lb (56.246 kg)  BMI 21.27 kg/m2  SpO2 100%  LMP 12/06/2015 Physical Exam  Constitutional: She is oriented to person, place, and time. She appears well-developed and well-nourished. No distress.  HENT:  Head: Normocephalic.  Eyes: Conjunctivae are normal. Pupils  are equal, round, and reactive to light. No scleral icterus.  Neck: Normal range of motion. Neck supple. No thyromegaly present.  Cardiovascular: Normal rate and regular rhythm.  Exam reveals no gallop and no friction rub.   No murmur heard. Pulmonary/Chest: Effort normal and breath sounds normal. No respiratory distress. She has no wheezes. She has no rales.  Abdominal: Soft. Bowel sounds are normal. She exhibits no distension. There is no tenderness. There is no rebound.  Musculoskeletal: Normal range of motion.  Neurological: She is  alert and oriented to person, place, and time.  No resting abnormal appearance to the face. Normal nasolabial fold. Has Bell's phenomenon and decreased strength to close the lids. Has 4 out of 5 strength to lower face with dynamic weakness of the left lower face. Otherwise normal symmetric cranial nerves. Normal peripheral neurological exam. Normal gait.  Skin: Skin is warm and dry. No rash noted.  Psychiatric: She has a normal mood and affect. Her behavior is normal.    ED Course  Procedures (including critical care time) Labs Review Labs Reviewed  CBC WITH DIFFERENTIAL/PLATELET - Abnormal; Notable for the following:    WBC 3.2 (*)    Neutro Abs 1.2 (*)    All other components within normal limits  BASIC METABOLIC PANEL  I-STAT BETA HCG BLOOD, ED ([facility], [facility], [facility] ONLY)    Imaging Review Ct Head Wo Contrast  12/20/2015  CLINICAL DATA:  30 year old female with approximately 12 hours a left upper arm tingling and left face numbness as well as dull frontal headache. She has a history of migraines in this recently begun a new anti migraine medication. EXAM: CT HEAD WITHOUT CONTRAST TECHNIQUE: Contiguous axial images were obtained from the base of the skull through the vertex without intravenous contrast. COMPARISON:  Prior head CT 02/11/2008 FINDINGS: Negative for acute intracranial hemorrhage, acute infarction, mass, mass effect, hydrocephalus or midline shift. Gray-white differentiation is preserved throughout. No acute soft tissue or calvarial abnormality. The globes and orbits are symmetric and unremarkable. Normal aeration of the mastoid air cells and visualized paranasal sinuses. IMPRESSION: Negative head CT. Electronically Signed   By: Malachy MoanHeath  McCullough M.D.   On: 12/20/2015 08:41   I have personally reviewed and evaluated these images and lab results as part of my medical decision-making.   EKG Interpretation None      MDM   Final diagnoses:  Bell's palsy    Peripheral seventh nerve  palsy. Otherwise normal neurological exam and normal CT. Positive Bell's phenomenon. Classic Bell's palsy. Plan Lacri-Lube drops, eye patch, Valtrex, prednisone, neurological follow-up.    Rolland PorterMark Jazmon Kos, MD 12/20/15 [phone]

## 2016-01-11 ENCOUNTER — Encounter (HOSPITAL_COMMUNITY): Payer: Self-pay

## 2016-01-11 ENCOUNTER — Inpatient Hospital Stay (HOSPITAL_COMMUNITY): Payer: BLUE CROSS/BLUE SHIELD

## 2016-01-11 ENCOUNTER — Inpatient Hospital Stay (HOSPITAL_COMMUNITY)
Admission: AD | Admit: 2016-01-11 | Discharge: 2016-01-11 | Disposition: A | Discharge status: Home / Self Care | Payer: BLUE CROSS/BLUE SHIELD | Source: Ambulatory Visit | Attending: Obstetrics & Gynecology | Admitting: Obstetrics & Gynecology

## 2016-01-11 DIAGNOSIS — Z3A01 Less than 8 weeks gestation of pregnancy: Secondary | ICD-10-CM | POA: Diagnosis not present

## 2016-01-11 DIAGNOSIS — O26891 Other specified pregnancy related conditions, first trimester: Secondary | ICD-10-CM | POA: Insufficient documentation

## 2016-01-11 DIAGNOSIS — O219 Vomiting of pregnancy, unspecified: Secondary | ICD-10-CM | POA: Diagnosis not present

## 2016-01-11 DIAGNOSIS — R109 Unspecified abdominal pain: Secondary | ICD-10-CM | POA: Diagnosis present

## 2016-01-11 DIAGNOSIS — O9989 Other specified diseases and conditions complicating pregnancy, childbirth and the puerperium: Secondary | ICD-10-CM | POA: Diagnosis not present

## 2016-01-11 DIAGNOSIS — O210 Mild hyperemesis gravidarum: Secondary | ICD-10-CM | POA: Diagnosis not present

## 2016-01-11 DIAGNOSIS — Z79899 Other long term (current) drug therapy: Secondary | ICD-10-CM | POA: Diagnosis not present

## 2016-01-11 DIAGNOSIS — O26899 Other specified pregnancy related conditions, unspecified trimester: Secondary | ICD-10-CM

## 2016-01-11 DIAGNOSIS — Z3490 Encounter for supervision of normal pregnancy, unspecified, unspecified trimester: Secondary | ICD-10-CM

## 2016-01-11 LAB — URINE MICROSCOPIC-ADD ON: WBC UA: NONE SEEN WBC/hpf (ref 0–5)

## 2016-01-11 LAB — URINALYSIS, ROUTINE W REFLEX MICROSCOPIC
Bilirubin Urine: NEGATIVE
GLUCOSE, UA: NEGATIVE mg/dL
Ketones, ur: NEGATIVE mg/dL
LEUKOCYTES UA: NEGATIVE
Nitrite: NEGATIVE
PH: 7.5 (ref 5.0–8.0)
Protein, ur: NEGATIVE mg/dL
SPECIFIC GRAVITY, URINE: 1.02 (ref 1.005–1.030)

## 2016-01-11 LAB — CBC
HCT: 35.6 % — ABNORMAL LOW (ref 36.0–46.0)
Hemoglobin: 12.8 g/dL (ref 12.0–15.0)
MCH: 30.8 pg (ref 26.0–34.0)
MCHC: 36 g/dL (ref 30.0–36.0)
MCV: 85.6 fL (ref 78.0–100.0)
PLATELETS: 215 10*3/uL (ref 150–400)
RBC: 4.16 MIL/uL (ref 3.87–5.11)
RDW: 12.2 % (ref 11.5–15.5)
WBC: 3.6 10*3/uL — AB (ref 4.0–10.5)

## 2016-01-11 LAB — HCG, QUANTITATIVE, PREGNANCY: HCG, BETA CHAIN, QUANT, S: 15600 m[IU]/mL — AB (ref <5)

## 2016-01-11 LAB — WET PREP, GENITAL
Clue Cells Wet Prep HPF POC: NONE SEEN
SPERM: NONE SEEN
Trich, Wet Prep: NONE SEEN
YEAST WET PREP: NONE SEEN

## 2016-01-11 LAB — POCT PREGNANCY, URINE: Preg Test, Ur: POSITIVE — AB

## 2016-01-11 LAB — ABO/RH: ABO/RH(D): A POS

## 2016-01-11 MED ORDER — PROMETHAZINE HCL 25 MG PO TABS: 12.5000 mg | ORAL_TABLET | Freq: Four times a day (QID) | ORAL | Status: DC | PRN

## 2016-01-11 NOTE — MAU Provider Note (Signed)
History     CSN: [id number]  Arrival date and time: 01/11/16 0865   First Provider Initiated Contact with Patient 01/11/16 1934      Chief Complaint  Patient presents with  . Abdominal Pain   HPI   Margaret Chaney is a 30 y.o. female G3P2002 at [redacted]w[redacted]d presenting to MAU with nausea, vomiting and abdominal. She has vomited twice in the last 24 hours.   The abdominal pain is located on the left side of her abdomen. The pain comes and goes. The pain is sharp at times. She currently rates her pain 3/10. She has not taken anything over the counter for the pain.   OB History    Gravida Para Term Preterm AB TAB SAB Ectopic Multiple Living   Past Medical History  Diagnosis Date  . Abnormal Pap smear   . Migraine     Past Surgical History  Procedure Laterality Date  . Leep  2007  . Laser ablation of the cervix  2003    Family History  Problem Relation Age of Onset  . Hypertension Mother     Social History  Substance Use Topics  . Smoking status: Never Smoker   . Smokeless tobacco: Never Used  . Alcohol Use: No    Allergies: No Known Allergies  Prescriptions prior to admission  Medication Sig Dispense Refill Last Dose  . ALPRAZolam (XANAX) 0.25 MG tablet Take 0.25 mg by mouth 2 (two) times daily as needed for anxiety.    Past Month at Unknown time  . baclofen (LIORESAL) 10 MG tablet Take 10 mg by mouth once a week.   Past Month at Unknown time  . escitalopram (LEXAPRO) 10 MG tablet Take 10 mg by mouth daily.   01/10/2016 at Unknown time  . fluticasone (FLONASE) 50 MCG/ACT nasal spray Place 1 spray into the nose daily as needed for allergies.    01/10/2016 at Unknown time  . zonisamide (ZONEGRAN) 25 MG capsule Take 25 mg by mouth daily.   Past Week at Unknown time  . artificial tears (LACRILUBE) OINT ophthalmic ointment Place into the left eye at bedtime as needed for dry eyes. (Patient not taking: Reported on 01/11/2016) 15 g 0   . predniSONE  (DELTASONE) 20 MG tablet Take 1 tablet (20 mg total) by mouth daily with breakfast. (Patient not taking: Reported on 01/11/2016) 10 tablet 0    Results for orders placed or performed during the hospital encounter of 01/11/16 (from the past 48 hour(s))  Urinalysis, Routine w reflex microscopic (not at [facility])     Status: Abnormal   Collection Time: 01/11/16  6:50 PM  Result Value Ref Range   Color, Urine YELLOW YELLOW   APPearance CLEAR CLEAR   Specific Gravity, Urine 1.020 1.005 - 1.030   pH 7.5 5.0 - 8.0   Glucose, UA NEGATIVE NEGATIVE mg/dL   Hgb urine dipstick TRACE (A) NEGATIVE   Bilirubin Urine NEGATIVE NEGATIVE   Ketones, ur NEGATIVE NEGATIVE mg/dL   Protein, ur NEGATIVE NEGATIVE mg/dL   Nitrite NEGATIVE NEGATIVE   Leukocytes, UA NEGATIVE NEGATIVE  Urine microscopic-add on     Status: Abnormal   Collection Time: 01/11/16  6:50 PM  Result Value Ref Range   Squamous Epithelial / LPF 0-5 (A) NONE SEEN   WBC, UA NONE SEEN 0 - 5 WBC/hpf   RBC / HPF 0-5 0 - 5 RBC/hpf   Bacteria,  UA RARE (A) NONE SEEN   Urine-Other MUCOUS PRESENT   Pregnancy, urine POC     Status: Abnormal   Collection Time: 01/11/16  7:07 PM  Result Value Ref Range   Preg Test, Ur POSITIVE (A) NEGATIVE    Comment:        THE SENSITIVITY OF THIS METHODOLOGY IS >24 mIU/mL     Review of Systems  Constitutional: Negative for fever and chills.  Gastrointestinal: Positive for nausea, vomiting and abdominal pain. Negative for diarrhea and constipation.  Genitourinary: Negative for dysuria.       Denies vaginal bleeding.    Physical Exam   Blood pressure 118/75, pulse 75, temperature 99.3 F (37.4 C), resp. rate 18, height 5\' 4"  (1.626 m), weight 120 lb 3.2 oz (54.522 kg), last menstrual period 12/04/2015.  Physical Exam  Constitutional: She is oriented to person, place, and time. She appears well-developed and well-nourished. No distress.  HENT:  Head: Normocephalic.  Eyes: Pupils are equal, round, and  reactive to light.  Respiratory: Effort normal.  GI: There is generalized tenderness. There is no rigidity, no rebound and no guarding.  Genitourinary: Vaginal discharge found.  Speculum exam: Vagina - Small amount of creamy, white discharge, no odor Cervix - No contact bleeding Bimanual exam: Cervix closed Uterus non tender, normal size Adnexa non tender, no masses bilaterally GC/Chlam, wet prep done Chaperone present for exam.  Musculoskeletal: Normal range of motion.  Neurological: She is alert and oriented to person, place, and time.  Skin: Skin is warm. She is not diaphoretic.  Psychiatric: Her behavior is normal.   Results for orders placed or performed during the hospital encounter of 01/11/16 (from the past 24 hour(s))  Urinalysis, Routine w reflex microscopic ([facility])     Status: Abnormal   Collection Time: 01/11/16  6:50 PM  Result Value Ref Range   Color, Urine YELLOW YELLOW   APPearance CLEAR CLEAR   Specific Gravity, Urine 1.020 1.005 - 1.030   pH 7.5 5.0 - 8.0   Glucose, UA NEGATIVE NEGATIVE mg/dL   Hgb urine dipstick TRACE (A) NEGATIVE   Bilirubin Urine NEGATIVE NEGATIVE   Ketones, ur NEGATIVE NEGATIVE mg/dL   Protein, ur NEGATIVE NEGATIVE mg/dL   Nitrite NEGATIVE NEGATIVE   Leukocytes, UA NEGATIVE NEGATIVE  Urine microscopic-add on     Status: Abnormal   Collection Time: 01/11/16  6:50 PM  Result Value Ref Range   Squamous Epithelial / LPF 0-5 (A) NONE SEEN   WBC, UA NONE SEEN 0 - 5 WBC/hpf   RBC / HPF 0-5 0 - 5 RBC/hpf   Bacteria, UA RARE (A) NONE SEEN   Urine-Other MUCOUS PRESENT   Pregnancy, urine POC     Status: Abnormal   Collection Time: 01/11/16  7:07 PM  Result Value Ref Range   Preg Test, Ur POSITIVE (A) NEGATIVE  Wet prep, genital     Status: Abnormal   Collection Time: 01/11/16  7:49 PM  Result Value Ref Range   Yeast Wet Prep HPF POC NONE SEEN NONE SEEN   Trich, Wet Prep NONE SEEN NONE SEEN   Clue Cells Wet Prep HPF POC NONE SEEN NONE  SEEN   WBC, Wet Prep HPF POC MODERATE (A) NONE SEEN   Sperm NONE SEEN   CBC     Status: Abnormal   Collection Time: 01/11/16  8:13 PM  Result Value Ref Range   WBC 3.6 (L) 4.0 - 10.5 K/uL   RBC 4.16 3.87 -  5.11 MIL/uL   Hemoglobin 12.8 12.0 - 15.0 g/dL   HCT 16.1 (L) 09.6 - 04.5 %   MCV 85.6 78.0 - 100.0 fL   MCH 30.8 26.0 - 34.0 pg   MCHC 36.0 30.0 - 36.0 g/dL   RDW 40.9 81.1 - 91.4 %   Platelets 215 150 - 400 K/uL  ABO/Rh     Status: None (Preliminary result)   Collection Time: 01/11/16  8:13 PM  Result Value Ref Range   ABO/RH(D) A POS   hCG, quantitative, pregnancy     Status: Abnormal   Collection Time: 01/11/16  8:13 PM  Result Value Ref Range   hCG, Beta Chain, Quant, S 15600 (H) <5 mIU/mL   US Ob Comp Less 14 Wks  01/11/2016  CLINICAL DATA:  30 year old pregnant female with abdominal pain. Quantitative beta HCG 15,600. EDC by LMP: 09/09/2016, projecting to an expected gestational age of [redacted] weeks 3 days. EXAM: OBSTETRIC <14 WK [place] AND TRANSVAGINAL OB US TECHNIQUE: Both transabdominal and transvaginal ultrasound examinations were performed for complete evaluation of the gestation as well as the maternal uterus, adnexal regions, and pelvic cul-de-sac. Transvaginal technique was performed to assess early pregnancy. COMPARISON:  No prior scans from this gestation. FINDINGS: Intrauterine gestational sac: Single intrauterine gestational sac appears normal an size, shape and position. Yolk sac:  Present. Embryo:  Tiny 1-2 mm embryo within the gestational sac. Embryonic Cardiac Activity: None detected. MSD: 12.4  mm   6 w   0 d            [place] EDC: 09/05/2016 CRL:  1.5  mm   less than 5 w Subchorionic hemorrhage: Questionable small perigestational bleed in the lower endometrial cavity involving less than 30% of the gestational sac circumference. Maternal uterus/adnexae: No uterine fibroids. Right ovarian corpus luteum. Simple 1.1 x 0.7 x 1.2 cm paraovarian/paratubal left adnexal cyst. No  suspicious ovarian or adnexal masses. No abnormal free fluid in the pelvis. IMPRESSION: 1. Single intrauterine gestation at 6 weeks 0 days by mean sac diameter. Tiny embryo within the gestational sac measuring less than [redacted] weeks gestational age by crown-rump length. No embryonic cardiac activity detected, which could be due to early gestational age. Questionable tiny perigestational bleed. A follow-up obstetric scan is advised in 1-2 weeks to assess for pregnancy viability. 2. No suspicious ovarian or adnexal findings. Simple 1.2 cm left adnexal paraovarian/paratubal cyst. Electronically Signed   By: Delbert Phenix M.D.   On: 01/11/2016 22:16   US Ob Transvaginal  01/11/2016  CLINICAL DATA:  30 year old pregnant female with abdominal pain. Quantitative beta HCG 15,600. EDC by LMP: 09/09/2016, projecting to an expected gestational age of [redacted] weeks 3 days. EXAM: OBSTETRIC <14 WK [place] AND TRANSVAGINAL OB US TECHNIQUE: Both transabdominal and transvaginal ultrasound examinations were performed for complete evaluation of the gestation as well as the maternal uterus, adnexal regions, and pelvic cul-de-sac. Transvaginal technique was performed to assess early pregnancy. COMPARISON:  No prior scans from this gestation. FINDINGS: Intrauterine gestational sac: Single intrauterine gestational sac appears normal an size, shape and position. Yolk sac:  Present. Embryo:  Tiny 1-2 mm embryo within the gestational sac. Embryonic Cardiac Activity: None detected. MSD: 12.4  mm   6 w   0 d            [place] EDC: 09/05/2016 CRL:  1.5  mm   less than 5 w Subchorionic hemorrhage: Questionable small perigestational bleed in the lower endometrial cavity involving less than 30% of  the gestational sac circumference. Maternal uterus/adnexae: No uterine fibroids. Right ovarian corpus luteum. Simple 1.1 x 0.7 x 1.2 cm paraovarian/paratubal left adnexal cyst. No suspicious ovarian or adnexal masses. No abnormal free fluid in the pelvis. IMPRESSION: 1.  Single intrauterine gestation at 6 weeks 0 days by mean sac diameter. Tiny embryo within the gestational sac measuring less than [redacted] weeks gestational age by crown-rump length. No embryonic cardiac activity detected, which could be due to early gestational age. Questionable tiny perigestational bleed. A follow-up obstetric scan is advised in 1-2 weeks to assess for pregnancy viability. 2. No suspicious ovarian or adnexal findings. Simple 1.2 cm left adnexal paraovarian/paratubal cyst. Electronically Signed   By: Delbert Phenix M.D.   On: 01/11/2016 22:16     MAU Course  Procedures  None  MDM  CBC Hcg ABO [place] Wet prep  GC  Report given to Thressa Sheller CNM who resumes care of the patient.   Duane Lope, NP   Assessment and Plan   1. Nausea/vomiting in pregnancy   2. Abdominal pain in pregnancy, antepartum   3. [redacted] weeks gestation of pregnancy   4. Intrauterine pregnancy    DC home Comfort measures reviewed  1st Trimester precautions  RX: phenergan PRN #[id number]  Return to MAU as needed FU with OB as planned  Follow-up Information    Schedule an appointment as soon as possible for a visit with [facility].   Contact information:   [address] [place] [address] [phone]

## 2016-01-11 NOTE — Discharge Instructions (Signed)
First Trimester of Pregnancy The first trimester of pregnancy is from week 1 until the end of week 12 (months 1 through 3). A week after a sperm fertilizes an egg, the egg will implant on the wall of the uterus. This embryo will begin to develop into a baby. Genes from you and your partner are forming the baby. The female genes determine whether the baby is a boy or a girl. At 6-8 weeks, the eyes and face are formed, and the heartbeat can be seen on ultrasound. At the end of 12 weeks, all the baby's organs are formed.  Now that you are pregnant, you will want to do everything you can to have a healthy baby. Two of the most important things are to get good prenatal care and to follow your health care provider's instructions. Prenatal care is all the medical care you receive before the baby's birth. This care will help prevent, find, and treat any problems during the pregnancy and childbirth. BODY CHANGES Your body goes through many changes during pregnancy. The changes vary from woman to woman.   You may gain or lose a couple of pounds at first.  You may feel sick to your stomach (nauseous) and throw up (vomit). If the vomiting is uncontrollable, call your health care provider.  You may tire easily.  You may develop headaches that can be relieved by medicines approved by your health care provider.  You may urinate more often. Painful urination may mean you have a bladder infection.  You may develop heartburn as a result of your pregnancy.  You may develop constipation because certain hormones are causing the muscles that push waste through your intestines to slow down.  You may develop hemorrhoids or swollen, bulging veins (varicose veins).  Your breasts may begin to grow larger and become tender. Your nipples may stick out more, and the tissue that surrounds them (areola) may become darker.  Your gums may bleed and may be sensitive to brushing and flossing.  Dark spots or blotches (chloasma,  mask of pregnancy) may develop on your face. This will likely fade after the baby is born.  Your menstrual periods will stop.  You may have a loss of appetite.  You may develop cravings for certain kinds of food.  You may have changes in your emotions from day to day, such as being excited to be pregnant or being concerned that something may go wrong with the pregnancy and baby.  You may have more vivid and strange dreams.  You may have changes in your hair. These can include thickening of your hair, rapid growth, and changes in texture. Some women also have hair loss during or after pregnancy, or hair that feels dry or thin. Your hair will most likely return to normal after your baby is born. WHAT TO EXPECT AT YOUR PRENATAL VISITS During a routine prenatal visit:  You will be weighed to make sure you and the baby are growing normally.  Your blood pressure will be taken.  Your abdomen will be measured to track your baby's growth.  The fetal heartbeat will be listened to starting around week 10 or 12 of your pregnancy.  Test results from any previous visits will be discussed. Your health care provider may ask you:  How you are feeling.  If you are feeling the baby move.  If you have had any abnormal symptoms, such as leaking fluid, bleeding, severe headaches, or abdominal cramping.  If you are using any tobacco products,   including cigarettes, chewing tobacco, and electronic cigarettes.  If you have any questions. Other tests that may be performed during your first trimester include:  Blood tests to find your blood type and to check for the presence of any previous infections. They will also be used to check for low iron levels (anemia) and Rh antibodies. Later in the pregnancy, blood tests for diabetes will be done along with other tests if problems develop.  Urine tests to check for infections, diabetes, or protein in the urine.  An ultrasound to confirm the proper growth  and development of the baby.  An amniocentesis to check for possible genetic problems.  Fetal screens for spina bifida and Down syndrome.  You may need other tests to make sure you and the baby are doing well.  HIV (human immunodeficiency virus) testing. Routine prenatal testing includes screening for HIV, unless you choose not to have this test. HOME CARE INSTRUCTIONS  Medicines  Follow your health care provider's instructions regarding medicine use. Specific medicines may be either safe or unsafe to take during pregnancy.  Take your prenatal vitamins as directed.  If you develop constipation, try taking a stool softener if your health care provider approves. Diet  Eat regular, well-balanced meals. Choose a variety of foods, such as meat or vegetable-based protein, fish, milk and low-fat dairy products, vegetables, fruits, and whole grain breads and cereals. Your health care provider will help you determine the amount of weight gain that is right for you.  Avoid raw meat and uncooked cheese. These carry germs that can cause birth defects in the baby.  Eating four or five small meals rather than three large meals a day may help relieve nausea and vomiting. If you start to feel nauseous, eating a few soda crackers can be helpful. Drinking liquids between meals instead of during meals also seems to help nausea and vomiting.  If you develop constipation, eat more high-fiber foods, such as fresh vegetables or fruit and whole grains. Drink enough fluids to keep your urine clear or pale yellow. Activity and Exercise  Exercise only as directed by your health care provider. Exercising will help you:  Control your weight.  Stay in shape.  Be prepared for labor and delivery.  Experiencing pain or cramping in the lower abdomen or low back is a good sign that you should stop exercising. Check with your health care provider before continuing normal exercises.  Try to avoid standing for long  periods of time. Move your legs often if you must stand in one place for a long time.  Avoid heavy lifting.  Wear low-heeled shoes, and practice good posture.  You may continue to have sex unless your health care provider directs you otherwise. Relief of Pain or Discomfort  Wear a good support bra for breast tenderness.   Take warm sitz baths to soothe any pain or discomfort caused by hemorrhoids. Use hemorrhoid cream if your health care provider approves.   Rest with your legs elevated if you have leg cramps or low back pain.  If you develop varicose veins in your legs, wear support hose. Elevate your feet for 15 minutes, 3-4 times a day. Limit salt in your diet. Prenatal Care  Schedule your prenatal visits by the twelfth week of pregnancy. They are usually scheduled monthly at first, then more often in the last 2 months before delivery.  Write down your questions. Take them to your prenatal visits.  Keep all your prenatal visits as directed by your   health care provider. Safety  Wear your seat belt at all times when driving.  Make a list of emergency phone numbers, including numbers for family, friends, the hospital, and police and fire departments. General Tips  Ask your health care provider for a referral to a local prenatal education class. Begin classes no later than at the beginning of month 6 of your pregnancy.  Ask for help if you have counseling or nutritional needs during pregnancy. Your health care provider can offer advice or refer you to specialists for help with various needs.  Do not use hot tubs, steam rooms, or saunas.  Do not douche or use tampons or scented sanitary pads.  Do not cross your legs for long periods of time.  Avoid cat litter boxes and soil used by cats. These carry germs that can cause birth defects in the baby and possibly loss of the fetus by miscarriage or stillbirth.  Avoid all smoking, herbs, alcohol, and medicines not prescribed by  your health care provider. Chemicals in these affect the formation and growth of the baby.  Do not use any tobacco products, including cigarettes, chewing tobacco, and electronic cigarettes. If you need help quitting, ask your health care provider. You may receive counseling support and other resources to help you quit.  Schedule a dentist appointment. At home, brush your teeth with a soft toothbrush and be gentle when you floss. SEEK MEDICAL CARE IF:   You have dizziness.  You have mild pelvic cramps, pelvic pressure, or nagging pain in the abdominal area.  You have persistent nausea, vomiting, or diarrhea.  You have a bad smelling vaginal discharge.  You have pain with urination.  You notice increased swelling in your face, hands, legs, or ankles. SEEK IMMEDIATE MEDICAL CARE IF:   You have a fever.  You are leaking fluid from your vagina.  You have spotting or bleeding from your vagina.  You have severe abdominal cramping or pain.  You have rapid weight gain or loss.  You vomit blood or material that looks like coffee grounds.  You are exposed to German measles and have never had them.  You are exposed to fifth disease or chickenpox.  You develop a severe headache.  You have shortness of breath.  You have any kind of trauma, such as from a fall or a car accident.   This information is not intended to replace advice given to you by your health care provider. Make sure you discuss any questions you have with your health care provider.   Document Released: 09/24/2001 Document Revised: 10/21/2014 Document Reviewed: 08/10/2013 Elsevier Interactive Patient Education 2016 [employer]  

## 2016-01-11 NOTE — MAU Note (Signed)
Pt c/o nausea and abd pain x 3 days.

## 2016-01-12 LAB — GC/CHLAMYDIA PROBE AMP (~~LOC~~) NOT AT ARMC
Chlamydia: NEGATIVE
NEISSERIA GONORRHEA: NEGATIVE

## 2016-01-12 LAB — HIV ANTIBODY (ROUTINE TESTING W REFLEX): HIV Screen 4th Generation wRfx: NONREACTIVE

## 2016-04-03 ENCOUNTER — Emergency Department (HOSPITAL_COMMUNITY)
Admission: EM | Admit: 2016-04-03 | Discharge: 2016-04-03 | Disposition: A | Discharge status: Home / Self Care | Payer: Medicaid Other | Attending: Emergency Medicine | Admitting: Emergency Medicine

## 2016-04-03 ENCOUNTER — Encounter (HOSPITAL_COMMUNITY): Payer: Self-pay

## 2016-04-03 DIAGNOSIS — R21 Rash and other nonspecific skin eruption: Secondary | ICD-10-CM | POA: Diagnosis present

## 2016-04-03 DIAGNOSIS — G43009 Migraine without aura, not intractable, without status migrainosus: Secondary | ICD-10-CM | POA: Diagnosis not present

## 2016-04-03 DIAGNOSIS — Z79899 Other long term (current) drug therapy: Secondary | ICD-10-CM | POA: Diagnosis not present

## 2016-04-03 MED ORDER — DIPHENHYDRAMINE HCL 50 MG/ML IJ SOLN
25.0000 mg | Freq: Once | INTRAMUSCULAR | Status: AC
  Administered 2016-04-03: 25 mg via INTRAVENOUS
  Filled 2016-04-03: qty 1

## 2016-04-03 MED ORDER — KETOROLAC TROMETHAMINE 30 MG/ML IJ SOLN
30.0000 mg | Freq: Once | INTRAMUSCULAR | Status: AC
  Administered 2016-04-03: 30 mg via INTRAVENOUS
  Filled 2016-04-03: qty 1

## 2016-04-03 MED ORDER — SODIUM CHLORIDE 0.9 % IV BOLUS (SEPSIS)
1000.0000 mL | Freq: Once | INTRAVENOUS | Status: AC
  Administered 2016-04-03: 1000 mL via INTRAVENOUS

## 2016-04-03 MED ORDER — VALPROATE SODIUM 500 MG/5ML IV SOLN
500.0000 mg | Freq: Once | INTRAVENOUS | Status: AC
  Administered 2016-04-03: 500 mg via INTRAVENOUS
  Filled 2016-04-03: qty 5

## 2016-04-03 MED ORDER — METOCLOPRAMIDE HCL 5 MG/ML IJ SOLN
10.0000 mg | Freq: Once | INTRAMUSCULAR | Status: AC
  Administered 2016-04-03: 10 mg via INTRAVENOUS
  Filled 2016-04-03: qty 2

## 2016-04-03 NOTE — ED Notes (Signed)
Pt presents with 3 day h/o frontal headache which pt reports is typical migraine headache. +nausea and photophobia; pt taking ibuprofen w/o relief.

## 2016-04-03 NOTE — ED Provider Notes (Signed)
CSN: [id number]     Arrival date & time 04/03/16  4259 History   First MD Initiated Contact with Patient 04/03/16 0945     No chief complaint on file.    (Consider location/radiation/quality/duration/timing/severity/associated sxs/prior Treatment) HPI Comments: Patient is a 30 year old female with history of migraines who presents with a headache for the past 3 days. Patient describes her headache as frontal and throbbing with associated photophobia, nausea, and fatigue. Patient states this is normal presentation of her migraine headaches. Patient has been taking ibuprofen without relief. Patient describes her headache as waxing and waning with her pain currently at a 4/10, but has been as bad as 7/10. Patient sees a headache specialist neurologist and is currently not on any prophylactic medications or acute medications. Patient was at one point on Imitrex, but her headache specialist discontinued his medication. Patient denies any neck pain or stiffness. Patient also denies any chest pain, shortness of breath, abdominal pain, vomiting, dysuria, weakness, numbness. The patient's last migraine was in March and she sought treatment in another hospital. Patient usually has good relief with headache cocktails.  The history is provided by the patient.    Past Medical History  Diagnosis Date  . Abnormal Pap smear   . Migraine    Past Surgical History  Procedure Laterality Date  . Leep  2007  . Laser ablation of the cervix  2003   Family History  Problem Relation Age of Onset  . Hypertension Mother    Social History  Substance Use Topics  . Smoking status: Never Smoker   . Smokeless tobacco: Never Used  . Alcohol Use: No   OB History    Gravida Para Term Preterm AB TAB SAB Ectopic Multiple Living   Review of Systems  Constitutional: Positive for fatigue. Negative for fever and chills.  HENT: Negative for facial swelling and sore throat.   Eyes: Positive for  photophobia. Negative for pain and visual disturbance.  Respiratory: Negative for shortness of breath.   Cardiovascular: Negative for chest pain.  Gastrointestinal: Positive for nausea. Negative for vomiting and abdominal pain.  Genitourinary: Negative for dysuria.  Musculoskeletal: Negative for back pain.  Skin: Negative for rash and wound.  Neurological: Positive for headaches.  Psychiatric/Behavioral: The patient is not nervous/anxious.       Allergies  Review of patient's allergies indicates no known allergies.  Home Medications   Prior to Admission medications   Medication Sig Start Date End Date Taking? Authorizing Provider  baclofen (LIORESAL) 10 MG tablet Take 10 mg by mouth once a week. 12/26/15  Yes Historical Provider, MD  escitalopram (LEXAPRO) 10 MG tablet Take 10 mg by mouth daily. 11/20/15  Yes Historical Provider, MD  fluticasone (FLONASE) 50 MCG/ACT nasal spray Place 1 spray into the nose daily as needed for allergies.  01/04/14  Yes Historical Provider, MD  promethazine (PHENERGAN) 25 MG tablet Take 0.5-1 tablets (12.5-25 mg total) by mouth every 6 (six) hours as needed. 01/11/16  Yes Armando Reichert, CNM  zonisamide (ZONEGRAN) 25 MG capsule Take 25 mg by mouth daily. 12/26/15  Yes Historical Provider, MD   BP 129/89 mmHg  Pulse 72  Temp(Src) 97.9 F (36.6 C) (Oral)  Resp 16  SpO2 100%  LMP 03/29/2016 (Approximate)  Breastfeeding? Unknown Physical Exam  Constitutional: She appears well-developed and well-nourished. No distress.  HENT:  Head: Normocephalic and atraumatic.  Mouth/Throat: Oropharynx is clear and moist.  No oropharyngeal exudate.  Eyes: Conjunctivae and EOM are normal. Pupils are equal, round, and reactive to light. Right eye exhibits no discharge. Left eye exhibits no discharge. No scleral icterus.  Neck: Normal range of motion. Neck supple. No thyromegaly present.  Cardiovascular: Normal rate, regular rhythm, normal heart sounds and intact distal  pulses.  Exam reveals no gallop and no friction rub.   No murmur heard. Pulmonary/Chest: Effort normal and breath sounds normal. No stridor. No respiratory distress. She has no wheezes. She has no rales.  Abdominal: Soft. Bowel sounds are normal. She exhibits no distension. There is no tenderness. There is no rebound and no guarding.  Musculoskeletal: She exhibits no edema.  Lymphadenopathy:    She has no cervical adenopathy.  Neurological: She is alert. Coordination normal.  CN 3-12 intact; normal sensation throughout; 5/5 strength in all 4 extremities; equal bilateral grip strength; no ataxia on finger to nose; no pronator drift   Skin: Skin is warm and dry. No rash noted. She is not diaphoretic. No pallor.  Psychiatric: She has a normal mood and affect.  Nursing note and vitals reviewed.   ED Course  Procedures (including critical care time) Labs Review Labs Reviewed - No data to display  Imaging Review No results found. I have personally reviewed and evaluated these images and lab results as part of my medical decision-making.   EKG Interpretation None      1100 On reevaluation after Toradol and 1 L of fluids, patient states that she is no better. I will try Reglan, Benadryl, valproate per Dr. Fayrene FearingJames' recommendation. Patient states she can get a ride home.  MDM   Pt HA treated and improved with Toradol, Compazine, Benadryl while in ED.  Presentation is like pts typical HA and non concerning [facility], [facility]CH, Meningitis, or temporal arteritis. Pt is afebrile with no focal neuro deficits, nuchal rigidity, or change in vision. Pt is to follow up with headache specialist to discuss prophylactic medication and/or migraine medication. Pt verbalizes understanding and is agreeable with plan to dc. Patient vitals stable throughout ED course and discharged in satisfactory condition.   Final diagnoses:  Migraine without aura and without status migrainosus, not intractable         Emi Holeslexandra M Samara Stankowski, PA-C 04/03/16 1319  Rolland PorterMark James, MD 04/10/16 [phone]

## 2016-04-03 NOTE — Discharge Instructions (Signed)
Please follow-up with your headache specialist for further evaluation and treatment of your recurrent migraines. It may be necessary to restart some medications. Please return to the emergency department if you develop any new or worsening symptoms.   Migraine Headache A migraine headache is an intense, throbbing pain on one or both sides of your head. A migraine can last for 30 minutes to several hours. CAUSES  The exact cause of a migraine headache is not always known. However, a migraine may be caused when nerves in the brain become irritated and release chemicals that cause inflammation. This causes pain. Certain things may also trigger migraines, such as:  Alcohol.  Smoking.  Stress.  Menstruation.  Aged cheeses.  Foods or drinks that contain nitrates, glutamate, aspartame, or tyramine.  Lack of sleep.  Chocolate.  Caffeine.  Hunger.  Physical exertion.  Fatigue.  Medicines used to treat chest pain (nitroglycerine), birth control pills, estrogen, and some blood pressure medicines. SIGNS AND SYMPTOMS  Pain on one or both sides of your head.  Pulsating or throbbing pain.  Severe pain that prevents daily activities.  Pain that is aggravated by any physical activity.  Nausea, vomiting, or both.  Dizziness.  Pain with exposure to bright lights, loud noises, or activity.  General sensitivity to bright lights, loud noises, or smells. Before you get a migraine, you may get warning signs that a migraine is coming (aura). An aura may include:  Seeing flashing lights.  Seeing bright spots, halos, or zigzag lines.  Having tunnel vision or blurred vision.  Having feelings of numbness or tingling.  Having trouble talking.  Having muscle weakness. DIAGNOSIS  A migraine headache is often diagnosed based on:  Symptoms.  Physical exam.  A CT scan or MRI of your head. These imaging tests cannot diagnose migraines, but they can help rule out other causes of  headaches. TREATMENT Medicines may be given for pain and nausea. Medicines can also be given to help prevent recurrent migraines.  HOME CARE INSTRUCTIONS  Only take over-the-counter or prescription medicines for pain or discomfort as directed by your health care provider. The use of long-term narcotics is not recommended.  Lie down in a dark, quiet room when you have a migraine.  Keep a journal to find out what may trigger your migraine headaches. For example, write down:  What you eat and drink.  How much sleep you get.  Any change to your diet or medicines.  Limit alcohol consumption.  Quit smoking if you smoke.  Get 7-9 hours of sleep, or as recommended by your health care provider.  Limit stress.  Keep lights dim if bright lights bother you and make your migraines worse. SEEK IMMEDIATE MEDICAL CARE IF:   Your migraine becomes severe.  You have a fever.  You have a stiff neck.  You have vision loss.  You have muscular weakness or loss of muscle control.  You start losing your balance or have trouble walking.  You feel faint or pass out.  You have severe symptoms that are different from your first symptoms. MAKE SURE YOU:   Understand these instructions.  Will watch your condition.  Will get help right away if you are not doing well or get worse.   This information is not intended to replace advice given to you by your health care provider. Make sure you discuss any questions you have with your health care provider.   Document Released: 09/30/2005 Document Revised: 10/21/2014 Document Reviewed: 06/07/2013 Elsevier Interactive Patient  Education ©2016 [employer] ° °

## 2016-04-10 ENCOUNTER — Inpatient Hospital Stay (HOSPITAL_COMMUNITY)
Admission: AD | Admit: 2016-04-10 | Discharge: 2016-04-11 | Disposition: A | Discharge status: Home / Self Care | Payer: Medicaid Other | Source: Ambulatory Visit | Attending: Obstetrics and Gynecology | Admitting: Obstetrics and Gynecology

## 2016-04-10 ENCOUNTER — Encounter (HOSPITAL_COMMUNITY): Payer: Self-pay | Admitting: *Deleted

## 2016-04-10 DIAGNOSIS — G43909 Migraine, unspecified, not intractable, without status migrainosus: Secondary | ICD-10-CM | POA: Diagnosis not present

## 2016-04-10 DIAGNOSIS — R102 Pelvic and perineal pain: Secondary | ICD-10-CM | POA: Diagnosis not present

## 2016-04-10 LAB — URINALYSIS, ROUTINE W REFLEX MICROSCOPIC
BILIRUBIN URINE: NEGATIVE
GLUCOSE, UA: NEGATIVE mg/dL
Ketones, ur: NEGATIVE mg/dL
Leukocytes, UA: NEGATIVE
Nitrite: NEGATIVE
PH: 8 (ref 5.0–8.0)
Protein, ur: 30 mg/dL — AB
SPECIFIC GRAVITY, URINE: 1.015 (ref 1.005–1.030)

## 2016-04-10 LAB — URINE MICROSCOPIC-ADD ON

## 2016-04-10 LAB — POCT PREGNANCY, URINE: Preg Test, Ur: NEGATIVE

## 2016-04-10 MED ORDER — IBUPROFEN 800 MG PO TABS
800.0000 mg | ORAL_TABLET | Freq: Once | ORAL | Status: AC
  Administered 2016-04-10: 800 mg via ORAL
  Filled 2016-04-10: qty 1

## 2016-04-10 NOTE — MAU Note (Signed)
Pt reports lower abd pain since yesterday 

## 2016-04-10 NOTE — MAU Provider Note (Signed)
History     CSN: [id number]  Arrival date and time: 04/10/16 2201   First Provider Initiated Contact with Patient 04/10/16 2324      Chief Complaint  Patient presents with  . Pelvic Pain   Pelvic Pain The patient's primary symptoms include pelvic pain. This is a new problem. The current episode started in the past 7 days. The problem occurs intermittently. The problem has been unchanged. Pain severity now: 6/10  The problem affects both sides. She is not pregnant. Associated symptoms include abdominal pain and nausea. Pertinent negatives include no chills, constipation, diarrhea, dysuria, fever, frequency, urgency or vomiting. The vaginal discharge was normal. There has been no bleeding. Nothing aggravates the symptoms. She has tried nothing for the symptoms. She is sexually active. She uses nothing for contraception. Her menstrual history has been regular (LMP 03/29/16 ).   Past Medical History  Diagnosis Date  . Abnormal Pap smear   . Migraine     Past Surgical History  Procedure Laterality Date  . Leep  2007  . Laser ablation of the cervix  2003    Family History  Problem Relation Age of Onset  . Hypertension Mother     Social History  Substance Use Topics  . Smoking status: Never Smoker   . Smokeless tobacco: Never Used  . Alcohol Use: No    Allergies: No Known Allergies  Prescriptions prior to admission  Medication Sig Dispense Refill Last Dose  . baclofen (LIORESAL) 10 MG tablet Take 10 mg by mouth once a week.   04/02/2016  . escitalopram (LEXAPRO) 10 MG tablet Take 10 mg by mouth daily.   04/02/2016  . fluticasone (FLONASE) 50 MCG/ACT nasal spray Place 1 spray into the nose daily as needed for allergies.    PRN  . promethazine (PHENERGAN) 25 MG tablet Take 0.5-1 tablets (12.5-25 mg total) by mouth every 6 (six) hours as needed. 30 tablet 0 PRN  . zonisamide (ZONEGRAN) 25 MG capsule Take 25 mg by mouth daily.   04/02/2016    Review of Systems  Constitutional:  Negative for fever and chills.  Gastrointestinal: Positive for nausea and abdominal pain. Negative for vomiting, diarrhea and constipation.  Genitourinary: Positive for pelvic pain. Negative for dysuria, urgency and frequency.   Physical Exam   Blood pressure 110/74, pulse 53, temperature 98.3 F (36.8 C), temperature source Oral, resp. rate 15, height 5\' 4"  (1.626 m), weight 55.792 kg (123 lb), last menstrual period 03/29/2016, SpO2 100 %, unknown if currently breastfeeding.  Physical Exam  Nursing note and vitals reviewed. Constitutional: She is oriented to person, place, and time. She appears well-developed and well-nourished. No distress.  HENT:  Head: Normocephalic.  Cardiovascular: Normal rate.   Respiratory: Effort normal.  GI: Soft. There is no tenderness. There is no rebound.  Genitourinary:   External: no lesion Vagina: small amount of white discharge Cervix: pink, smooth, no CMT Uterus: NSSC Adnexa: NT   Neurological: She is alert and oriented to person, place, and time.  Skin: Skin is warm and dry.  Psychiatric: She has a normal mood and affect.   Results for orders placed or performed during the hospital encounter of 04/10/16 (from the past 24 hour(s))  Urinalysis, Routine w reflex microscopic (not at [facility])     Status: Abnormal   Collection Time: 04/10/16 10:13 PM  Result Value Ref Range   Color, Urine YELLOW YELLOW   APPearance CLEAR CLEAR   Specific Gravity, Urine 1.015 1.005 - 1.030  pH 8.0 5.0 - 8.0   Glucose, UA NEGATIVE NEGATIVE mg/dL   Hgb urine dipstick TRACE (A) NEGATIVE   Bilirubin Urine NEGATIVE NEGATIVE   Ketones, ur NEGATIVE NEGATIVE mg/dL   Protein, ur 30 (A) NEGATIVE mg/dL   Nitrite NEGATIVE NEGATIVE   Leukocytes, UA NEGATIVE NEGATIVE  Urine microscopic-add on     Status: Abnormal   Collection Time: 04/10/16 10:13 PM  Result Value Ref Range   Squamous Epithelial / LPF 0-5 (A) NONE SEEN   WBC, UA 0-5 0 - 5 WBC/hpf   RBC / HPF 0-5 0 - 5  RBC/hpf   Bacteria, UA RARE (A) NONE SEEN   Urine-Other MUCOUS PRESENT   Pregnancy, urine POC     Status: None   Collection Time: 04/10/16 10:21 PM  Result Value Ref Range   Preg Test, Ur NEGATIVE NEGATIVE  Wet prep, genital     Status: Abnormal   Collection Time: 04/10/16 11:42 PM  Result Value Ref Range   Yeast Wet Prep HPF POC NONE SEEN NONE SEEN   Trich, Wet Prep NONE SEEN NONE SEEN   Clue Cells Wet Prep HPF POC NONE SEEN NONE SEEN   WBC, Wet Prep HPF POC FEW (A) NONE SEEN   Sperm NONE SEEN   CBC     Status: Abnormal   Collection Time: 04/11/16 12:00 AM  Result Value Ref Range   WBC 5.4 4.0 - 10.5 K/uL   RBC 4.00 3.87 - 5.11 MIL/uL   Hemoglobin 12.1 12.0 - 15.0 g/dL   HCT 78.434.5 (L) 69.636.0 - 29.546.0 %   MCV 86.3 78.0 - 100.0 fL   MCH 30.3 26.0 - 34.0 pg   MCHC 35.1 30.0 - 36.0 g/dL   RDW 28.411.9 13.211.5 - 44.015.5 %   Platelets 252 150 - 400 K/uL     MAU Course  Procedures  MDM Patient has had ibuprofen for the pain. She reports improvement.  Assessment and Plan   1. Pelvic pain in female    DC home Comfort measures reviewed  RX: ibuprofen PRN #30  Return to MAU as needed   Follow-up Information    [facility]   Why:  If symptoms worsen   Contact information:   [address] [place] [address] [phone]         Margaret Chaney, Margaret Chaney 04/10/2016, 11:32 PM

## 2016-04-11 DIAGNOSIS — R102 Pelvic and perineal pain: Secondary | ICD-10-CM

## 2016-04-11 LAB — CBC
HEMATOCRIT: 34.5 % — AB (ref 36.0–46.0)
HEMOGLOBIN: 12.1 g/dL (ref 12.0–15.0)
MCH: 30.3 pg (ref 26.0–34.0)
MCHC: 35.1 g/dL (ref 30.0–36.0)
MCV: 86.3 fL (ref 78.0–100.0)
Platelets: 252 10*3/uL (ref 150–400)
RBC: 4 MIL/uL (ref 3.87–5.11)
RDW: 11.9 % (ref 11.5–15.5)
WBC: 5.4 10*3/uL (ref 4.0–10.5)

## 2016-04-11 LAB — GC/CHLAMYDIA PROBE AMP (~~LOC~~) NOT AT ARMC
Chlamydia: NEGATIVE
NEISSERIA GONORRHEA: NEGATIVE

## 2016-04-11 LAB — HIV ANTIBODY (ROUTINE TESTING W REFLEX): HIV Screen 4th Generation wRfx: NONREACTIVE

## 2016-04-11 LAB — WET PREP, GENITAL
Clue Cells Wet Prep HPF POC: NONE SEEN
Sperm: NONE SEEN
TRICH WET PREP: NONE SEEN
YEAST WET PREP: NONE SEEN

## 2016-04-11 LAB — RPR: RPR: NONREACTIVE

## 2016-04-11 MED ORDER — IBUPROFEN 800 MG PO TABS: 800.0000 mg | ORAL_TABLET | Freq: Three times a day (TID) | ORAL | Status: DC | PRN

## 2016-04-11 NOTE — Discharge Instructions (Signed)

## 2016-05-08 ENCOUNTER — Other Ambulatory Visit: Payer: Self-pay | Admitting: Obstetrics & Gynecology

## 2016-05-09 LAB — CYTOLOGY - PAP

## 2016-05-15 DIAGNOSIS — R102 Pelvic and perineal pain: Secondary | ICD-10-CM | POA: Diagnosis not present

## 2016-05-16 ENCOUNTER — Other Ambulatory Visit: Payer: Self-pay | Admitting: Obstetrics & Gynecology

## 2016-05-16 DIAGNOSIS — N72 Inflammatory disease of cervix uteri: Secondary | ICD-10-CM | POA: Diagnosis not present

## 2016-05-16 DIAGNOSIS — R87810 Cervical high risk human papillomavirus (HPV) DNA test positive: Secondary | ICD-10-CM | POA: Diagnosis not present

## 2016-05-16 DIAGNOSIS — R87610 Atypical squamous cells of undetermined significance on cytologic smear of cervix (ASC-US): Secondary | ICD-10-CM | POA: Diagnosis not present

## 2016-05-16 DIAGNOSIS — Z3202 Encounter for pregnancy test, result negative: Secondary | ICD-10-CM | POA: Diagnosis not present

## 2016-05-16 DIAGNOSIS — D069 Carcinoma in situ of cervix, unspecified: Secondary | ICD-10-CM | POA: Diagnosis not present

## 2016-05-18 DIAGNOSIS — J029 Acute pharyngitis, unspecified: Secondary | ICD-10-CM | POA: Diagnosis not present

## 2016-07-03 ENCOUNTER — Other Ambulatory Visit: Payer: Self-pay | Admitting: Obstetrics & Gynecology

## 2016-07-03 DIAGNOSIS — R87613 High grade squamous intraepithelial lesion on cytologic smear of cervix (HGSIL): Secondary | ICD-10-CM | POA: Diagnosis not present

## 2016-07-03 DIAGNOSIS — N870 Mild cervical dysplasia: Secondary | ICD-10-CM | POA: Diagnosis not present

## 2016-07-03 DIAGNOSIS — Z3202 Encounter for pregnancy test, result negative: Secondary | ICD-10-CM | POA: Diagnosis not present

## 2016-09-12 DIAGNOSIS — N760 Acute vaginitis: Secondary | ICD-10-CM | POA: Diagnosis not present

## 2016-12-08 ENCOUNTER — Emergency Department (HOSPITAL_COMMUNITY)
Admission: EM | Admit: 2016-12-08 | Discharge: 2016-12-08 | Disposition: A | Discharge status: Home / Self Care | Payer: 59 | Attending: Emergency Medicine | Admitting: Emergency Medicine

## 2016-12-08 ENCOUNTER — Encounter (HOSPITAL_COMMUNITY): Payer: Self-pay | Admitting: *Deleted

## 2016-12-08 DIAGNOSIS — R079 Chest pain, unspecified: Secondary | ICD-10-CM | POA: Diagnosis not present

## 2016-12-08 DIAGNOSIS — Z79899 Other long term (current) drug therapy: Secondary | ICD-10-CM | POA: Insufficient documentation

## 2016-12-08 DIAGNOSIS — G4489 Other headache syndrome: Secondary | ICD-10-CM | POA: Diagnosis not present

## 2016-12-08 DIAGNOSIS — R51 Headache: Secondary | ICD-10-CM | POA: Diagnosis present

## 2016-12-08 MED ORDER — IBUPROFEN 200 MG PO TABS
600.0000 mg | ORAL_TABLET | Freq: Once | ORAL | Status: DC
  Filled 2016-12-08: qty 1

## 2016-12-08 NOTE — ED Notes (Signed)
Patient left at this time with all belongings. 

## 2016-12-08 NOTE — ED Notes (Signed)
Pt wanting to leave, notified MD. Refused motrin

## 2016-12-08 NOTE — Discharge Instructions (Signed)
Your caregiver has diagnosed you as having chest pain that is not specific for one problem, but does not require admission.  Chest pain comes from many different causes.  °SEEK IMMEDIATE MEDICAL ATTENTION IF: °You have severe chest pain, especially if the pain is crushing or pressure-like and spreads to the arms, back, neck, or jaw, or if you have sweating, nausea (feeling sick to your stomach), or shortness of breath. THIS IS AN EMERGENCY. Don't wait to see if the pain will go away. Get medical help at once. Call 911 or 0 (operator). DO NOT drive yourself to the hospital.  °Your chest pain gets worse and does not go away with rest.  °You have an attack of chest pain lasting longer than usual, despite rest and treatment with the medications your caregiver has prescribed.  °You wake from sleep with chest pain or shortness of breath.  °You feel dizzy or faint.  °You have chest pain not typical of your usual pain for which you originally saw your caregiver. ° °You are having a headache. No specific cause was found today for your headache. It may have been a migraine or other cause of headache. Stress, anxiety, fatigue, and depression are common triggers for headaches. Your headache today does not appear to be life-threatening or require hospitalization, but often the exact cause of headaches is not determined in the emergency department. Therefore, follow-up with your doctor is very important to find out what may have caused your headache, and whether or not you need any further diagnostic testing or treatment. Sometimes headaches can appear benign (not harmful), but then more serious symptoms can develop which should prompt an immediate re-evaluation by your doctor or the emergency department. ° °SEEK MEDICAL ATTENTION IF: ° °You develop possible problems with medications prescribed.  °The medications don't resolve your headache, if it recurs , or if you have multiple episodes of vomiting or can't take fluids. °You  have a change from the usual headache. ° °RETURN IMMEDIATELY IF you develop a sudden, severe headache or confusion, become poorly responsive or faint, develop a fever above 100.4F or problem breathing, have a change in speech, vision, swallowing, or understanding, or develop new weakness, numbness, tingling, incoordination, or have a seizure. ° °

## 2016-12-08 NOTE — ED Provider Notes (Signed)
[facility]-EMERGENCY DEPT Provider Note   CSN: [id number] Arrival date & time: 12/08/16  4098     History   Chief Complaint Chief Complaint  Patient presents with  . Headache    HPI Margaret Chaney is a 31 y.o. female.  The history is provided by the patient.  Headache   This is a new problem. The current episode started 2 days ago. The problem occurs constantly. The problem has been gradually improving. The pain is located in the temporal region. The pain is moderate. The pain does not radiate. Pertinent negatives include no fever and no vomiting. Treatments tried: baclofen. The treatment provided moderate relief.  Chest Pain   This is a new problem. The problem has been resolved. The pain is present in the substernal region. Associated symptoms include headaches. Pertinent negatives include no fever, no vomiting and no weakness.  pt is here for 2 issues:  1. She has h/o migraines, reports onset of HA 2 days ago, gradual in onset.  She reports nausea and lightheadedness.  No syncope.  No focal weakness.  She reports HA similar to prior and not as severe but did have more lightheadedness than usual.  2. She reports brief episodes of chest pressure and SOB.  None at this time.   She denies any known cardiac conditions but does report strong fam h/o CAD  Past Medical History:  Diagnosis Date  . Abnormal Pap smear   . Migraine     There are no active problems to display for this patient.   Past Surgical History:  Procedure Laterality Date  . LASER ABLATION OF THE CERVIX  2003  . LEEP  2007    OB History    Gravida Para Term Preterm AB Living   3 2 2   1 2    SAB TAB Ectopic Multiple Live Births     1             Home Medications    Prior to Admission medications   Medication Sig Start Date End Date Taking? Authorizing Provider  baclofen (LIORESAL) 10 MG tablet Take 10 mg by mouth 3 (three) times daily as needed for muscle spasms.  12/26/15  Yes Historical Provider,  MD  ibuprofen (ADVIL,MOTRIN) 800 MG tablet Take 1 tablet (800 mg total) by mouth every 8 (eight) hours as needed. 04/11/16  Yes Armando Reichert, CNM    Family History Family History  Problem Relation Age of Onset  . Hypertension Mother     Social History Social History  Substance Use Topics  . Smoking status: Never Smoker  . Smokeless tobacco: Never Used  . Alcohol use No     Allergies   Patient has no known allergies.   Review of Systems Review of Systems  Constitutional: Negative for fever.  Eyes:       "floaters" in vision but no visual loss  Cardiovascular: Positive for chest pain.  Gastrointestinal: Negative for vomiting.  Neurological: Positive for headaches. Negative for syncope and weakness.  All other systems reviewed and are negative.    Physical Exam Updated Vital Signs BP 123/86   Pulse (!) 57   Temp 98 F (36.7 C) (Oral)   Resp 16   Ht 5\' 4"  (1.626 m)   Wt 53.5 kg   LMP 12/03/2016   SpO2 100%   BMI 20.25 kg/m   Physical Exam CONSTITUTIONAL: Well developed/well nourished HEAD: Normocephalic/atraumatic EYES: EOMI/PERRL, no nystagmus, no ptosis, normal fundoscopic exam (no papilledema)  ENMT:  Mucous membranes moist NECK: supple no meningeal signs, no bruits SPINE/BACK:entire spine nontender CV: S1/S2 noted, no murmurs/rubs/gallops noted LUNGS: Lungs are clear to auscultation bilaterally, no apparent distress ABDOMEN: soft, nontender, no rebound or guarding GU:no cva tenderness NEURO:Awake/alert, face symmetric, no arm or leg drift is noted Equal 5/5 strength with shoulder abduction, elbow flex/extension, wrist flex/extension in upper extremities and equal hand grips bilaterally Equal 5/5 strength with hip flexion,knee flex/extension, foot dorsi/plantar flexion Cranial nerves 3/4/5/6/04/21/09/11/12 tested and intact Gait normal without ataxia No past pointing Sensation to light touch intact in all extremities EXTREMITIES: pulses normal, full  ROM SKIN: warm, color normal PSYCH: no abnormalities of mood noted, alert and oriented to situation    ED Treatments / Results  Labs (all labs ordered are listed, but only abnormal results are displayed) Labs Reviewed - No data to display  EKG  EKG Interpretation  Date/Time:  Sunday December 08 2016 03:54:09 EST Ventricular Rate:  64 PR Interval:  142 QRS Duration: 82 QT Interval:  396 QTC Calculation: 408 R Axis:   92 Text Interpretation:  Normal sinus rhythm with sinus arrhythmia Rightward axis Borderline ECG No significant change since last tracing artifact noted Confirmed by Bebe ShaggyWICKLINE  MD, Romonia Yanik ([id number]) on 12/08/2016 5:10:48 AM       Radiology No results found.  Procedures Procedures   Medications Ordered in ED Medications  ibuprofen (ADVIL,MOTRIN) tablet 600 mg (600 mg Oral Refused 12/08/16 0541)     Initial Impression / Assessment and Plan / ED Course  I have reviewed the triage vital signs and the nursing notes.  Pertinent labs & imaging results that were available during my care of the patient were reviewed by me and considered in my medical decision making (see chart for details).     Pt already improved on my evaluations She is well appearing No neuro deficits, HA similar to prior, defer workup  As for CP - none at this time, EKG unchanged.  Doubt ACS/PE/Dissection at this time  Overall well appearing and appropriate for d/c home We discussed strict ER return precautions  Final Clinical Impressions(s) / ED Diagnoses   Final diagnoses:  Other headache syndrome  Chest pain, unspecified type    New Prescriptions New Prescriptions   No medications on file     Zadie Rhineonald Moncia Annas, MD 12/08/16 [phone]

## 2016-12-08 NOTE — ED Triage Notes (Signed)
Pt c/o headache and lightheadedness since Friday with mild nausea. Also reports chest tightness

## 2017-01-10 ENCOUNTER — Telehealth (INDEPENDENT_AMBULATORY_CARE_PROVIDER_SITE_OTHER): Payer: Self-pay | Admitting: Nurse Practitioner

## 2017-01-10 DIAGNOSIS — J069 Acute upper respiratory infection, unspecified: Secondary | ICD-10-CM

## 2017-01-10 NOTE — Progress Notes (Signed)

## 2017-01-22 DIAGNOSIS — R531 Weakness: Secondary | ICD-10-CM | POA: Diagnosis not present

## 2017-01-22 DIAGNOSIS — R42 Dizziness and giddiness: Secondary | ICD-10-CM | POA: Diagnosis not present

## 2017-01-22 DIAGNOSIS — M25569 Pain in unspecified knee: Secondary | ICD-10-CM | POA: Diagnosis not present

## 2017-01-22 DIAGNOSIS — Z0001 Encounter for general adult medical examination with abnormal findings: Secondary | ICD-10-CM | POA: Diagnosis not present

## 2017-01-22 DIAGNOSIS — Z0000 Encounter for general adult medical examination without abnormal findings: Secondary | ICD-10-CM | POA: Diagnosis not present

## 2017-01-22 DIAGNOSIS — N761 Subacute and chronic vaginitis: Secondary | ICD-10-CM | POA: Diagnosis not present

## 2017-01-22 DIAGNOSIS — Z6820 Body mass index (BMI) 20.0-20.9, adult: Secondary | ICD-10-CM | POA: Diagnosis not present

## 2017-02-17 DIAGNOSIS — E0590 Thyrotoxicosis, unspecified without thyrotoxic crisis or storm: Secondary | ICD-10-CM | POA: Diagnosis not present

## 2017-02-27 ENCOUNTER — Other Ambulatory Visit (HOSPITAL_COMMUNITY): Payer: Self-pay | Admitting: Internal Medicine

## 2017-02-27 DIAGNOSIS — E039 Hypothyroidism, unspecified: Secondary | ICD-10-CM

## 2017-02-28 ENCOUNTER — Other Ambulatory Visit (HOSPITAL_COMMUNITY): Payer: Self-pay | Admitting: Internal Medicine

## 2017-02-28 DIAGNOSIS — E0590 Thyrotoxicosis, unspecified without thyrotoxic crisis or storm: Secondary | ICD-10-CM

## 2017-03-06 ENCOUNTER — Encounter (HOSPITAL_COMMUNITY): Payer: BLUE CROSS/BLUE SHIELD

## 2017-03-07 ENCOUNTER — Other Ambulatory Visit (HOSPITAL_COMMUNITY): Payer: BLUE CROSS/BLUE SHIELD

## 2017-03-14 DIAGNOSIS — N911 Secondary amenorrhea: Secondary | ICD-10-CM | POA: Diagnosis not present

## 2017-03-20 ENCOUNTER — Encounter (HOSPITAL_COMMUNITY)
Admission: RE | Admit: 2017-03-20 | Discharge: 2017-03-20 | Disposition: A | Discharge status: Home / Self Care | Payer: 59 | Source: Ambulatory Visit | Attending: Internal Medicine | Admitting: Internal Medicine

## 2017-03-20 DIAGNOSIS — E0590 Thyrotoxicosis, unspecified without thyrotoxic crisis or storm: Secondary | ICD-10-CM | POA: Insufficient documentation

## 2017-03-20 MED ORDER — SODIUM IODIDE I 131 CAPSULE
12.3000 | Freq: Once | INTRAVENOUS | Status: AC | PRN
  Administered 2017-03-20: 12.3 via ORAL

## 2017-03-21 ENCOUNTER — Encounter (HOSPITAL_COMMUNITY)
Admission: RE | Admit: 2017-03-21 | Discharge: 2017-03-21 | Disposition: A | Discharge status: Home / Self Care | Payer: 59 | Source: Ambulatory Visit | Attending: Internal Medicine | Admitting: Internal Medicine

## 2017-03-21 DIAGNOSIS — E0590 Thyrotoxicosis, unspecified without thyrotoxic crisis or storm: Secondary | ICD-10-CM | POA: Diagnosis not present

## 2017-03-21 MED ORDER — SODIUM PERTECHNETATE TC 99M INJECTION
10.0000 | Freq: Once | INTRAVENOUS | Status: AC | PRN
  Administered 2017-03-21: 10 via INTRAVENOUS

## 2017-05-22 DIAGNOSIS — D649 Anemia, unspecified: Secondary | ICD-10-CM | POA: Diagnosis not present

## 2017-05-22 DIAGNOSIS — E0590 Thyrotoxicosis, unspecified without thyrotoxic crisis or storm: Secondary | ICD-10-CM | POA: Diagnosis not present

## 2017-07-15 DIAGNOSIS — Z111 Encounter for screening for respiratory tuberculosis: Secondary | ICD-10-CM | POA: Diagnosis not present

## 2017-08-06 DIAGNOSIS — N760 Acute vaginitis: Secondary | ICD-10-CM | POA: Diagnosis not present

## 2017-08-27 DIAGNOSIS — M25569 Pain in unspecified knee: Secondary | ICD-10-CM | POA: Diagnosis not present

## 2017-08-27 DIAGNOSIS — E0591 Thyrotoxicosis, unspecified with thyrotoxic crisis or storm: Secondary | ICD-10-CM | POA: Diagnosis not present

## 2017-08-27 DIAGNOSIS — E0590 Thyrotoxicosis, unspecified without thyrotoxic crisis or storm: Secondary | ICD-10-CM | POA: Diagnosis not present

## 2017-10-21 DIAGNOSIS — M25569 Pain in unspecified knee: Secondary | ICD-10-CM | POA: Diagnosis not present

## 2017-11-12 DIAGNOSIS — F410 Panic disorder [episodic paroxysmal anxiety] without agoraphobia: Secondary | ICD-10-CM | POA: Diagnosis not present

## 2018-01-28 DIAGNOSIS — N761 Subacute and chronic vaginitis: Secondary | ICD-10-CM | POA: Diagnosis not present

## 2018-01-28 DIAGNOSIS — E0590 Thyrotoxicosis, unspecified without thyrotoxic crisis or storm: Secondary | ICD-10-CM | POA: Diagnosis not present

## 2018-01-28 DIAGNOSIS — F410 Panic disorder [episodic paroxysmal anxiety] without agoraphobia: Secondary | ICD-10-CM | POA: Diagnosis not present

## 2018-01-28 DIAGNOSIS — Z0001 Encounter for general adult medical examination with abnormal findings: Secondary | ICD-10-CM | POA: Diagnosis not present

## 2018-01-28 DIAGNOSIS — Z118 Encounter for screening for other infectious and parasitic diseases: Secondary | ICD-10-CM | POA: Diagnosis not present

## 2018-01-28 DIAGNOSIS — Z113 Encounter for screening for infections with a predominantly sexual mode of transmission: Secondary | ICD-10-CM | POA: Diagnosis not present

## 2018-07-28 DIAGNOSIS — R51 Headache: Secondary | ICD-10-CM | POA: Diagnosis not present

## 2018-08-04 ENCOUNTER — Telehealth: Payer: 59 | Admitting: Physician Assistant

## 2018-08-04 DIAGNOSIS — N898 Other specified noninflammatory disorders of vagina: Secondary | ICD-10-CM | POA: Diagnosis not present

## 2018-08-04 MED ORDER — METRONIDAZOLE 0.75 % VA GEL: 1.0000 | Freq: Two times a day (BID) | VAGINAL | 0 refills | Status: DC

## 2018-08-04 MED ORDER — FLUCONAZOLE 150 MG PO TABS: 150.0000 mg | ORAL_TABLET | Freq: Once | ORAL | 0 refills | Status: AC

## 2018-08-04 NOTE — Progress Notes (Signed)
We are sorry that you are not feeling well. Here is how we plan to help! Based on what you shared with me it looks like you: May have a vaginosis due to bacteria and/or yeast.  Vaginosis is an inflammation of the vagina that can result in discharge, itching and pain. The cause is usually a change in the normal balance of vaginal bacteria or an infection. Vaginosis can also result from reduced estrogen levels after menopause.  The most common causes of vaginosis are:   Bacterial vaginosis which results from an overgrowth of one on several organisms that are normally present in your vagina.   Yeast infections which are caused by a naturally occurring fungus called candida.   Vaginal atrophy (atrophic vaginosis) which results from the thinning of the vagina from reduced estrogen levels after menopause.   Trichomoniasis which is caused by a parasite and is commonly transmitted by sexual intercourse.  Factors that increase your risk of developing vaginosis include: Medications, such as antibiotics and steroids Uncontrolled diabetes Use of hygiene products such as bubble bath, vaginal spray or vaginal deodorant Douching Wearing damp or tight-fitting clothing Using an intrauterine device (IUD) for birth control Hormonal changes, such as those associated with pregnancy, birth control pills or menopause Sexual activity Having a sexually transmitted infection  Your treatment plan is diflucan and metrogel to treat for yeast vaginosis and bacterial vaginosis.  I have electronically sent this prescription into the pharmacy that you have chosen.  Be sure to take all of the medication as directed. Stop taking any medication if you develop a rash, tongue swelling or shortness of breath. Mothers who are breast feeding should consider pumping and discarding their breast milk while on these antibiotics. However, there is no consensus that infant exposure at these doses would be harmful.  Remember that  medication creams can weaken latex condoms. Marland Kitchen

## 2018-09-16 DIAGNOSIS — N760 Acute vaginitis: Secondary | ICD-10-CM | POA: Diagnosis not present

## 2019-01-31 ENCOUNTER — Telehealth: Payer: 59 | Admitting: Nurse Practitioner

## 2019-01-31 DIAGNOSIS — B9689 Other specified bacterial agents as the cause of diseases classified elsewhere: Secondary | ICD-10-CM | POA: Diagnosis not present

## 2019-01-31 DIAGNOSIS — N760 Acute vaginitis: Secondary | ICD-10-CM

## 2019-01-31 MED ORDER — METRONIDAZOLE 500 MG PO TABS: 500.0000 mg | ORAL_TABLET | Freq: Two times a day (BID) | ORAL | 0 refills | Status: DC

## 2019-01-31 NOTE — Progress Notes (Signed)

## 2019-02-04 DIAGNOSIS — Z0001 Encounter for general adult medical examination with abnormal findings: Secondary | ICD-10-CM | POA: Diagnosis not present

## 2019-02-04 DIAGNOSIS — N760 Acute vaginitis: Secondary | ICD-10-CM | POA: Diagnosis not present

## 2019-02-04 DIAGNOSIS — E0590 Thyrotoxicosis, unspecified without thyrotoxic crisis or storm: Secondary | ICD-10-CM | POA: Diagnosis not present

## 2019-02-04 DIAGNOSIS — Z118 Encounter for screening for other infectious and parasitic diseases: Secondary | ICD-10-CM | POA: Diagnosis not present

## 2019-02-04 DIAGNOSIS — Z113 Encounter for screening for infections with a predominantly sexual mode of transmission: Secondary | ICD-10-CM | POA: Diagnosis not present

## 2019-07-31 ENCOUNTER — Emergency Department (HOSPITAL_COMMUNITY)
Admission: EM | Admit: 2019-07-31 | Discharge: 2019-07-31 | Disposition: A | Discharge status: Home / Self Care | Payer: 59 | Attending: Emergency Medicine | Admitting: Emergency Medicine

## 2019-07-31 ENCOUNTER — Emergency Department (HOSPITAL_COMMUNITY): Payer: 59

## 2019-07-31 ENCOUNTER — Other Ambulatory Visit: Payer: Self-pay

## 2019-07-31 ENCOUNTER — Encounter (HOSPITAL_COMMUNITY): Payer: Self-pay | Admitting: *Deleted

## 2019-07-31 DIAGNOSIS — M7918 Myalgia, other site: Secondary | ICD-10-CM | POA: Diagnosis not present

## 2019-07-31 DIAGNOSIS — R0902 Hypoxemia: Secondary | ICD-10-CM | POA: Diagnosis not present

## 2019-07-31 DIAGNOSIS — M791 Myalgia, unspecified site: Secondary | ICD-10-CM | POA: Diagnosis not present

## 2019-07-31 DIAGNOSIS — Y9389 Activity, other specified: Secondary | ICD-10-CM | POA: Insufficient documentation

## 2019-07-31 DIAGNOSIS — Y999 Unspecified external cause status: Secondary | ICD-10-CM | POA: Insufficient documentation

## 2019-07-31 DIAGNOSIS — R519 Headache, unspecified: Secondary | ICD-10-CM | POA: Diagnosis not present

## 2019-07-31 DIAGNOSIS — S0990XA Unspecified injury of head, initial encounter: Secondary | ICD-10-CM | POA: Insufficient documentation

## 2019-07-31 DIAGNOSIS — Y92410 Unspecified street and highway as the place of occurrence of the external cause: Secondary | ICD-10-CM | POA: Insufficient documentation

## 2019-07-31 DIAGNOSIS — R05 Cough: Secondary | ICD-10-CM | POA: Diagnosis not present

## 2019-07-31 DIAGNOSIS — I10 Essential (primary) hypertension: Secondary | ICD-10-CM | POA: Diagnosis not present

## 2019-07-31 MED ORDER — METHOCARBAMOL 500 MG PO TABS: 500.0000 mg | ORAL_TABLET | Freq: Two times a day (BID) | ORAL | 0 refills | Status: DC

## 2019-07-31 MED ORDER — IBUPROFEN 600 MG PO TABS: 600.0000 mg | ORAL_TABLET | Freq: Four times a day (QID) | ORAL | 0 refills | Status: DC | PRN

## 2019-07-31 NOTE — ED Notes (Signed)
Patient verbalizes understanding of discharge instructions. Opportunity for questioning and answers were provided. Armband removed by staff, pt discharged from ED.  
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
## 2019-07-31 NOTE — ED Notes (Signed)
Referred CT to xray to pickup pt.

## 2019-07-31 NOTE — ED Notes (Signed)
Patient transported to X-ray 

## 2019-11-16 DIAGNOSIS — M170 Bilateral primary osteoarthritis of knee: Secondary | ICD-10-CM | POA: Diagnosis not present

## 2019-11-16 DIAGNOSIS — R5383 Other fatigue: Secondary | ICD-10-CM | POA: Diagnosis not present

## 2019-11-16 DIAGNOSIS — E559 Vitamin D deficiency, unspecified: Secondary | ICD-10-CM | POA: Diagnosis not present

## 2019-11-16 DIAGNOSIS — M2550 Pain in unspecified joint: Secondary | ICD-10-CM | POA: Diagnosis not present
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
## 2020-09-24 ENCOUNTER — Telehealth: Payer: 59 | Admitting: Physician Assistant

## 2020-09-24 ENCOUNTER — Encounter: Payer: Self-pay | Admitting: Physician Assistant

## 2020-09-24 DIAGNOSIS — N760 Acute vaginitis: Secondary | ICD-10-CM

## 2020-09-24 DIAGNOSIS — N898 Other specified noninflammatory disorders of vagina: Secondary | ICD-10-CM | POA: Diagnosis not present

## 2020-09-24 MED ORDER — METRONIDAZOLE 500 MG PO TABS: 500.0000 mg | ORAL_TABLET | Freq: Two times a day (BID) | ORAL | 0 refills | Status: AC

## 2020-09-24 NOTE — Progress Notes (Signed)
We are sorry that you are not feeling well. Here is how we plan to help! Based on what you shared with me it looks like you: May have a vaginosis due to bacteria     Vaginosis is an inflammation of the vagina that can result in discharge, itching and pain. The cause is usually a change in the normal balance of vaginal bacteria or an infection. Vaginosis can also result from reduced estrogen levels after menopause.  The most common causes of vaginosis are:   Bacterial vaginosis which results from an overgrowth of one on several organisms that are normally present in your vagina.   Yeast infections which are caused by a naturally occurring fungus called candida.   Vaginal atrophy (atrophic vaginosis) which results from the thinning of the vagina from reduced estrogen levels after menopause.   Trichomoniasis which is caused by a parasite and is commonly transmitted by sexual intercourse.  Factors that increase your risk of developing vaginosis include: Marland Kitchen Medications, such as antibiotics and steroids . Uncontrolled diabetes . Use of hygiene products such as bubble bath, vaginal spray or vaginal deodorant . Douching . Wearing damp or tight-fitting clothing . Using an intrauterine device (IUD) for birth control . Hormonal changes, such as those associated with pregnancy, birth control pills or menopause . Sexual activity . Having a sexually transmitted infection  Your treatment plan is Metronidazole or Flagyl 500mg  twice a day for 7 days.  I have electronically sent this prescription into the pharmacy that you have chosen.  Be sure to take all of the medication as directed. Stop taking any medication if you develop a rash, tongue swelling or shortness of breath. Mothers who are breast feeding should consider pumping and discarding their breast milk while on these antibiotics. However, there is no consensus that infant exposure at these doses would be harmful.  Remember that medication creams  can weaken latex condoms.  Ms. Belongia,  If your discharge continues please consider STD testing, due to your sexual encounter with new partner.   HOME CARE:  Good hygiene may prevent some types of vaginosis from recurring and may relieve some symptoms:  . Avoid baths, hot tubs and whirlpool spas. Rinse soap from your outer genital area after a shower, and dry the area well to prevent irritation. Don't use scented or harsh soaps, such as those with deodorant or antibacterial action. Avoid irritants. These include scented tampons and pads. . Wipe from front to back after using the toilet. Doing so avoids spreading fecal bacteria to your vagina.  Other things that may help prevent vaginosis include:  Marland Kitchen Don't douche. Your vagina doesn't require cleansing other than normal bathing. Repetitive douching disrupts the normal organisms that reside in the vagina and can actually increase your risk of vaginal infection. Douching won't clear up a vaginal infection. . Use a latex condom. Both female and female latex condoms may help you avoid infections spread by sexual contact. . Wear cotton underwear. Also wear pantyhose with a cotton crotch. If you feel comfortable without it, skip wearing underwear to bed. Yeast thrives in Marland Kitchen Your symptoms should improve in the next day or two.  GET HELP RIGHT AWAY IF:  . You have pain in your lower abdomen ( pelvic area or over your ovaries) . You develop nausea or vomiting . You develop a fever . Your discharge changes or worsens . You have persistent pain with intercourse . You develop shortness of breath, a rapid pulse, or you  faint.  These symptoms could be signs of problems or infections that need to be evaluated by a medical provider now.  MAKE SURE YOU    Understand these instructions.  Will watch your condition.  Will get help right away if you are not doing well or get worse.  Your e-visit answers were reviewed by a board  certified advanced clinical practitioner to complete your personal care plan. Depending upon the condition, your plan could have included both over the counter or prescription medications. Please review your pharmacy choice to make sure that you have choses a pharmacy that is open for you to pick up any needed prescription, Your safety is important to [place]. If you have drug allergies check your prescription carefully.   You can use MyChart to ask questions about today's visit, request a non-urgent call back, or ask for a work or school excuse for 24 hours related to this e-Visit. If it has been greater than 24 hours you will need to follow up with your provider, or enter a new e-Visit to address those concerns. You will get a MyChart message within the next two days asking about your experience. I hope that your e-visit has been valuable and will speed your recovery. I spent 5-10 minutes on review and completion of this note- Illa Level [facility]

## 2020-11-03 DIAGNOSIS — Z0001 Encounter for general adult medical examination with abnormal findings: Secondary | ICD-10-CM | POA: Diagnosis not present

## 2020-11-03 DIAGNOSIS — Z118 Encounter for screening for other infectious and parasitic diseases: Secondary | ICD-10-CM | POA: Diagnosis not present

## 2020-11-03 DIAGNOSIS — N920 Excessive and frequent menstruation with regular cycle: Secondary | ICD-10-CM | POA: Diagnosis not present

## 2020-11-03 DIAGNOSIS — Z113 Encounter for screening for infections with a predominantly sexual mode of transmission: Secondary | ICD-10-CM | POA: Diagnosis not present

## 2020-11-03 DIAGNOSIS — E559 Vitamin D deficiency, unspecified: Secondary | ICD-10-CM | POA: Diagnosis not present

## 2020-11-03 DIAGNOSIS — E0590 Thyrotoxicosis, unspecified without thyrotoxic crisis or storm: Secondary | ICD-10-CM | POA: Diagnosis not present

## 2021-07-23 ENCOUNTER — Telehealth: Payer: 59 | Admitting: Physician Assistant

## 2021-07-23 DIAGNOSIS — B3731 Acute candidiasis of vulva and vagina: Secondary | ICD-10-CM | POA: Diagnosis not present

## 2021-07-23 MED ORDER — FLUCONAZOLE 150 MG PO TABS: 150.0000 mg | ORAL_TABLET | Freq: Once | ORAL | 0 refills | Status: AC

## 2021-07-23 NOTE — Progress Notes (Signed)

## 2021-10-25 ENCOUNTER — Other Ambulatory Visit (HOSPITAL_COMMUNITY): Payer: Self-pay

## 2021-10-25 MED ORDER — MIRTAZAPINE 15 MG PO TABS
15.0000 mg | ORAL_TABLET | Freq: Every day | ORAL | 0 refills | Status: DC
  Filled 2021-10-25: qty 30, 30d supply, fill #0

## 2021-11-01 ENCOUNTER — Other Ambulatory Visit (HOSPITAL_COMMUNITY): Payer: Self-pay

## 2021-11-01 DIAGNOSIS — Z0000 Encounter for general adult medical examination without abnormal findings: Secondary | ICD-10-CM | POA: Diagnosis not present

## 2021-11-01 DIAGNOSIS — B3731 Acute candidiasis of vulva and vagina: Secondary | ICD-10-CM | POA: Diagnosis not present

## 2021-11-01 MED ORDER — FLUCONAZOLE 150 MG PO TABS
150.0000 mg | ORAL_TABLET | Freq: Every day | ORAL | 1 refills | Status: DC | PRN
  Filled 2021-11-01: qty 1, 1d supply, fill #0

## 2021-11-13 DIAGNOSIS — Z124 Encounter for screening for malignant neoplasm of cervix: Secondary | ICD-10-CM | POA: Diagnosis not present

## 2021-11-13 DIAGNOSIS — Z113 Encounter for screening for infections with a predominantly sexual mode of transmission: Secondary | ICD-10-CM | POA: Diagnosis not present

## 2021-12-24 ENCOUNTER — Other Ambulatory Visit (HOSPITAL_COMMUNITY): Payer: Self-pay

## 2021-12-24 MED ORDER — MIRTAZAPINE 15 MG PO TABS
15.0000 mg | ORAL_TABLET | Freq: Every day | ORAL | 3 refills | Status: DC
  Filled 2021-12-24: qty 30, 30d supply, fill #0

## 2022-05-23 ENCOUNTER — Other Ambulatory Visit (HOSPITAL_COMMUNITY): Payer: Self-pay

## 2022-05-23 ENCOUNTER — Telehealth: Payer: 59 | Admitting: Physician Assistant

## 2022-05-23 DIAGNOSIS — J019 Acute sinusitis, unspecified: Secondary | ICD-10-CM | POA: Diagnosis not present

## 2022-05-23 DIAGNOSIS — B9689 Other specified bacterial agents as the cause of diseases classified elsewhere: Secondary | ICD-10-CM

## 2022-05-23 MED ORDER — AMOXICILLIN-POT CLAVULANATE 875-125 MG PO TABS
1.0000 | ORAL_TABLET | Freq: Two times a day (BID) | ORAL | 0 refills | Status: DC
  Filled 2022-05-23: qty 14, 7d supply, fill #0

## 2022-05-23 NOTE — Progress Notes (Signed)

## 2022-05-23 NOTE — Progress Notes (Signed)
I have spent 5 minutes in review of e-visit questionnaire, review and updating patient chart, medical decision making and response to patient.   Lashay Osborne Cody Ottie Tillery, PA-C    

## 2022-10-14 ENCOUNTER — Encounter (HOSPITAL_COMMUNITY): Payer: Self-pay

## 2022-10-14 ENCOUNTER — Ambulatory Visit (HOSPITAL_COMMUNITY)
Admission: RE | Admit: 2022-10-14 | Discharge: 2022-10-14 | Disposition: A | Discharge status: Home / Self Care | Payer: Commercial Managed Care - PPO | Source: Ambulatory Visit | Attending: Internal Medicine | Admitting: Internal Medicine

## 2022-10-14 VITALS — BP 128/99 | HR 82 | Temp 98.0°F | Resp 12

## 2022-10-14 DIAGNOSIS — Z1152 Encounter for screening for COVID-19: Secondary | ICD-10-CM | POA: Insufficient documentation

## 2022-10-14 DIAGNOSIS — J101 Influenza due to other identified influenza virus with other respiratory manifestations: Secondary | ICD-10-CM | POA: Insufficient documentation

## 2022-10-14 LAB — POCT RAPID STREP A, ED / UC: Streptococcus, Group A Screen (Direct): NEGATIVE

## 2022-10-14 MED ORDER — IBUPROFEN 600 MG PO TABS: 600.0000 mg | ORAL_TABLET | Freq: Four times a day (QID) | ORAL | 0 refills | Status: AC | PRN

## 2022-10-14 MED ORDER — OSELTAMIVIR PHOSPHATE 75 MG PO CAPS: 75.0000 mg | ORAL_CAPSULE | Freq: Two times a day (BID) | ORAL | 0 refills | Status: AC

## 2022-10-14 NOTE — ED Triage Notes (Signed)
Pt is here for sore throat , cough, nasal drainage, bilateral ear a pain x1day

## 2022-10-14 NOTE — Discharge Instructions (Addendum)
Increase oral fluid intake Warm salt water gargle as needed Chloraseptic throat spray as needed Take medications as prescribed You may return to work if you afebrile for 24 hours without any antipyretic use If you have worsening symptoms please return to urgent care to be reevaluated

## 2022-10-14 NOTE — ED Provider Notes (Signed)
[place]    CSN: [id number] Arrival date & time: 10/14/22  1015      History   Chief Complaint Chief Complaint  Patient presents with   Sore Throat    Sore throat can barely swallow it hurts feels like drainage running down throat and feels like ears are stopped up - Entered by patient    HPI Tamula S Kobashigawa is a 37 y.o. female comes to urgent care with 1 day history of sore throat and nasal congestion as well as bilateral ear fullness.  Patient was exposed to a confirmed case of influenza B few days ago.  She endorses sore throat and difficulty swallowing.  No generalized body aches or headaches.  No nausea or vomiting.  No diarrhea.  No loss of taste or smell.   HPI  Past Medical History:  Diagnosis Date   Abnormal Pap smear    Migraine     There are no problems to display for this patient.   Past Surgical History:  Procedure Laterality Date   LASER ABLATION OF THE CERVIX  2003   LEEP  2007    OB History     Gravida  3   Para  2   Term  2   Preterm      AB  1   Living  2      SAB      IAB  1   Ectopic      Multiple      Live Births               Home Medications    Prior to Admission medications   Medication Sig Start Date End Date Taking? Authorizing Provider  ibuprofen (ADVIL) 600 MG tablet Take 1 tablet (600 mg total) by mouth every 6 (six) hours as needed. 10/14/22  Yes Shellye Zandi, Myrene Galas, MD  oseltamivir (TAMIFLU) 75 MG capsule Take 1 capsule (75 mg total) by mouth every 12 (twelve) hours. 10/14/22  Yes Chaya Dehaan, Myrene Galas, MD    Family History Family History  Problem Relation Age of Onset   Hypertension Mother     Social History Social History   Tobacco Use   Smoking status: Never   Smokeless tobacco: Never  Substance Use Topics   Alcohol use: No   Drug use: No     Allergies   Patient has no known allergies.   Review of Systems Review of Systems  Constitutional: Negative.   HENT:  Positive for  congestion and sore throat.   Respiratory:  Positive for cough. Negative for chest tightness, wheezing and stridor.   Genitourinary: Negative.   Musculoskeletal: Negative.      Physical Exam Triage Vital Signs ED Triage Vitals [10/14/22 1025]  Enc Vitals Group     BP (!) 128/99     Pulse Rate 82     Resp 12     Temp 98 F (36.7 C)     Temp Source Oral     SpO2 98 %     Weight      Height      Head Circumference      Peak Flow      Pain Score 9     Pain Loc      Pain Edu?      Excl. in [place]?    No data found.  Updated Vital Signs BP (!) 128/99 (BP Location: Right Arm)   Pulse 82   Temp 98 F (36.7 C) (  Oral)   Resp 12   LMP 10/07/2022   SpO2 98%   Visual Acuity Right Eye Distance:   Left Eye Distance:   Bilateral Distance:    Right Eye Near:   Left Eye Near:    Bilateral Near:     Physical Exam Vitals and nursing note reviewed.  Constitutional:      Appearance: She is well-developed.  HENT:     Right Ear: Tympanic membrane normal.     Left Ear: Tympanic membrane normal.     Nose: No rhinorrhea.     Mouth/Throat:     Mouth: Mucous membranes are moist.     Pharynx: No oropharyngeal exudate or posterior oropharyngeal erythema.     Tonsils: No tonsillar exudate or tonsillar abscesses. 1+ on the right. 1+ on the left.  Cardiovascular:     Rate and Rhythm: Normal rate and regular rhythm.  Pulmonary:     Effort: Pulmonary effort is normal.     Breath sounds: Normal breath sounds.  Neurological:     Mental Status: She is alert.      UC Treatments / Results  Labs (all labs ordered are listed, but only abnormal results are displayed) Labs Reviewed  CULTURE, GROUP A STREP ([place])  SARS CORONAVIRUS 2 (TAT 6-24 HRS)  POCT RAPID STREP A, ED / UC    EKG   Radiology No results found.  Procedures Procedures (including critical care time)  Medications Ordered in UC Medications - No data to display  Initial Impression / Assessment and Plan / UC  Course  I have reviewed the triage vital signs and the nursing notes.  Pertinent labs & imaging results that were available during my care of the patient were reviewed by me and considered in my medical decision making (see chart for details).     1.  Influenza B: Point-of-care strep test is negative Throat cultures have been sent Increase oral fluid intake COVID-19 PCR test has been sent as well Tamiflu twice daily for 5 days Tylenol/Motrin as needed for pain and/or fever Will call patient with recommendations if labs are abnormal Return precautions given. Final Clinical Impressions(s) / UC Diagnoses   Final diagnoses:  Influenza B     Discharge Instructions      Increase oral fluid intake Warm salt water gargle as needed Chloraseptic throat spray as needed Take medications as prescribed You may return to work if you afebrile for 24 hours without any antipyretic use If you have worsening symptoms please return to urgent care to be reevaluated   ED Prescriptions     Medication Sig Dispense Auth. Provider   oseltamivir (TAMIFLU) 75 MG capsule Take 1 capsule (75 mg total) by mouth every 12 (twelve) hours. 10 capsule Leeum Sankey, Myrene Galas, MD   ibuprofen (ADVIL) 600 MG tablet Take 1 tablet (600 mg total) by mouth every 6 (six) hours as needed. 30 tablet Telesforo Brosnahan, Myrene Galas, MD      PDMP not reviewed this encounter.   Chase Picket, MD 10/14/22 1125

## 2022-10-15 LAB — SARS CORONAVIRUS 2 (TAT 6-24 HRS): SARS Coronavirus 2: NEGATIVE

## 2022-10-15 LAB — CULTURE, GROUP A STREP (THRC)

## 2022-10-16 LAB — CULTURE, GROUP A STREP (THRC)

## 2022-10-17 ENCOUNTER — Other Ambulatory Visit (HOSPITAL_COMMUNITY): Payer: Self-pay

## 2022-10-17 ENCOUNTER — Telehealth: Payer: Commercial Managed Care - PPO | Admitting: Family Medicine

## 2022-10-17 DIAGNOSIS — J039 Acute tonsillitis, unspecified: Secondary | ICD-10-CM | POA: Diagnosis not present

## 2022-10-17 MED ORDER — AMOXICILLIN 500 MG PO CAPS
500.0000 mg | ORAL_CAPSULE | Freq: Two times a day (BID) | ORAL | 0 refills | Status: AC
  Filled 2022-10-17: qty 20, 10d supply, fill #0

## 2022-10-17 NOTE — Progress Notes (Signed)
E-Visit for Sore Throat - Strep/Bacterial Symptoms  We are sorry that you are not feeling well.  Here is how we plan to help!  Based on what you have shared with me it is likely that you have bacterial pharyngitis.  This pharyngitis is inflammation and infection in the back of the throat.  This is an infection cause by bacteria and is treated with antibiotics.  I have prescribed Amoxicillin 500 mg twice a day for 10 days. For throat pain, we recommend over the counter oral pain relief medications such as acetaminophen or aspirin, or anti-inflammatory medications such as ibuprofen or naproxen sodium. Topical treatments such as oral throat lozenges or sprays may be used as needed. These infections are not as easily transmitted as other respiratory infections, however we still recommend that you avoid close contact with loved ones, especially the very young and elderly.  Remember to wash your hands thoroughly throughout the day as this is the number one way to prevent the spread of infection and wipe down door knobs and counters with disinfectant.   Home Care: Only take medications as instructed by your medical team. Complete the entire course of an antibiotic. Do not take these medications with alcohol. A steam or ultrasonic humidifier can help congestion.  You can place a towel over your head and breathe in the steam from hot water coming from a faucet. Avoid close contacts especially the very young and the elderly. Cover your mouth when you cough or sneeze. Always remember to wash your hands.  Get Help Right Away If: You develop worsening fever or sinus pain. You develop a severe head ache or visual changes. Your symptoms persist after you have completed your treatment plan.  Make sure you Understand these instructions. Will watch your condition. Will get help right away if you are not doing well or get worse.   Thank you for choosing an e-visit.  Your e-visit answers were reviewed by a  board certified advanced clinical practitioner to complete your personal care plan. Depending upon the condition, your plan could have included both over the counter or prescription medications.  Please review your pharmacy choice. Make sure the pharmacy is open so you can pick up prescription now. If there is a problem, you may contact your provider through [employer] and have the prescription routed to another pharmacy.  Your safety is important to [place]. If you have drug allergies check your prescription carefully.   For the next 24 hours you can use MyChart to ask questions about today's visit, request a non-urgent call back, or ask for a work or school excuse. You will get an email in the next two days asking about your experience. I hope that your e-visit has been valuable and will speed your recovery.  I provided 5 minutes of non face-to-face time during this encounter for chart review, medication and order placement, as well as and documentation.

## 2022-10-31 ENCOUNTER — Other Ambulatory Visit (HOSPITAL_COMMUNITY): Payer: Self-pay

## 2022-10-31 DIAGNOSIS — E782 Mixed hyperlipidemia: Secondary | ICD-10-CM | POA: Diagnosis not present

## 2022-10-31 DIAGNOSIS — Z113 Encounter for screening for infections with a predominantly sexual mode of transmission: Secondary | ICD-10-CM | POA: Diagnosis not present

## 2022-10-31 DIAGNOSIS — N924 Excessive bleeding in the premenopausal period: Secondary | ICD-10-CM | POA: Diagnosis not present

## 2022-10-31 DIAGNOSIS — D649 Anemia, unspecified: Secondary | ICD-10-CM | POA: Diagnosis not present

## 2022-10-31 DIAGNOSIS — E0590 Thyrotoxicosis, unspecified without thyrotoxic crisis or storm: Secondary | ICD-10-CM | POA: Diagnosis not present

## 2022-10-31 DIAGNOSIS — Z0001 Encounter for general adult medical examination with abnormal findings: Secondary | ICD-10-CM | POA: Diagnosis not present

## 2022-10-31 MED ORDER — MIRTAZAPINE 15 MG PO TABS
15.0000 mg | ORAL_TABLET | Freq: Every day | ORAL | 3 refills | Status: DC
  Filled 2022-10-31: qty 30, 30d supply, fill #0
  Filled 2022-12-18: qty 30, 30d supply, fill #1
  Filled 2023-02-10: qty 30, 30d supply, fill #2
  Filled 2023-06-27: qty 30, 30d supply, fill #3

## 2022-12-05 DIAGNOSIS — R030 Elevated blood-pressure reading, without diagnosis of hypertension: Secondary | ICD-10-CM | POA: Diagnosis not present

## 2023-01-30 DIAGNOSIS — D649 Anemia, unspecified: Secondary | ICD-10-CM | POA: Diagnosis not present

## 2023-01-30 DIAGNOSIS — R030 Elevated blood-pressure reading, without diagnosis of hypertension: Secondary | ICD-10-CM | POA: Diagnosis not present

## 2023-02-09 ENCOUNTER — Telehealth: Payer: Commercial Managed Care - PPO | Admitting: Family

## 2023-02-09 DIAGNOSIS — J069 Acute upper respiratory infection, unspecified: Secondary | ICD-10-CM

## 2023-02-09 MED ORDER — BENZONATATE 100 MG PO CAPS: 100.0000 mg | ORAL_CAPSULE | Freq: Three times a day (TID) | ORAL | 0 refills | Status: AC | PRN

## 2023-02-09 MED ORDER — FLUTICASONE PROPIONATE 50 MCG/ACT NA SUSP: 2.0000 | Freq: Every day | NASAL | 6 refills | Status: AC

## 2023-02-09 NOTE — Progress Notes (Signed)

## 2023-02-10 ENCOUNTER — Other Ambulatory Visit (HOSPITAL_COMMUNITY): Payer: Self-pay

## 2023-02-10 ENCOUNTER — Other Ambulatory Visit: Payer: Self-pay

## 2023-02-10 DIAGNOSIS — J029 Acute pharyngitis, unspecified: Secondary | ICD-10-CM | POA: Diagnosis not present

## 2023-02-10 DIAGNOSIS — B349 Viral infection, unspecified: Secondary | ICD-10-CM | POA: Diagnosis not present

## 2023-02-10 MED ORDER — MELOXICAM 15 MG PO TABS
15.0000 mg | ORAL_TABLET | Freq: Every day | ORAL | 0 refills | Status: AC
  Filled 2023-02-10: qty 14, 14d supply, fill #0

## 2023-02-10 MED ORDER — LIDOCAINE VISCOUS HCL 2 % MT SOLN
15.0000 mL | OROMUCOSAL | 0 refills | Status: AC
  Filled 2023-02-10: qty 200, 3d supply, fill #0

## 2023-02-11 ENCOUNTER — Other Ambulatory Visit (HOSPITAL_COMMUNITY): Payer: Self-pay

## 2023-02-11 MED ORDER — AMOXICILLIN 500 MG PO CAPS
500.0000 mg | ORAL_CAPSULE | Freq: Two times a day (BID) | ORAL | 0 refills | Status: AC
  Filled 2023-02-11: qty 20, 10d supply, fill #0

## 2023-02-12 ENCOUNTER — Other Ambulatory Visit (HOSPITAL_COMMUNITY): Payer: Self-pay

## 2023-07-22 DIAGNOSIS — E0590 Thyrotoxicosis, unspecified without thyrotoxic crisis or storm: Secondary | ICD-10-CM | POA: Diagnosis not present

## 2023-07-30 ENCOUNTER — Telehealth: Payer: Commercial Managed Care - PPO | Admitting: Family Medicine

## 2023-07-30 DIAGNOSIS — R109 Unspecified abdominal pain: Secondary | ICD-10-CM

## 2023-07-30 DIAGNOSIS — N898 Other specified noninflammatory disorders of vagina: Secondary | ICD-10-CM

## 2023-07-30 NOTE — Progress Notes (Signed)
Because belly pain, vaginal discharge and a new sexual partner, I feel your condition warrants further evaluation and I recommend that you be seen in a face to face visit.   NOTE: There will be NO CHARGE for this eVisit   If you are having a true medical emergency please call 911.

## 2023-08-01 ENCOUNTER — Other Ambulatory Visit (HOSPITAL_COMMUNITY): Payer: Self-pay

## 2023-08-01 DIAGNOSIS — N760 Acute vaginitis: Secondary | ICD-10-CM | POA: Diagnosis not present

## 2023-08-01 MED ORDER — FLUCONAZOLE 150 MG PO TABS
150.0000 mg | ORAL_TABLET | Freq: Every day | ORAL | 1 refills | Status: AC | PRN
  Filled 2023-08-01: qty 1, 1d supply, fill #0
  Filled 2023-11-01: qty 1, 1d supply, fill #1

## 2023-08-01 MED ORDER — METRONIDAZOLE 500 MG PO TABS
500.0000 mg | ORAL_TABLET | Freq: Two times a day (BID) | ORAL | 0 refills | Status: AC
  Filled 2023-08-01: qty 14, 7d supply, fill #0

## 2023-08-04 ENCOUNTER — Other Ambulatory Visit (HOSPITAL_COMMUNITY): Payer: Self-pay

## 2023-08-07 ENCOUNTER — Other Ambulatory Visit (HOSPITAL_COMMUNITY): Payer: Self-pay

## 2023-08-07 MED ORDER — CLINDAMYCIN PHOSPHATE 2 % VA CREA
1.0000 | TOPICAL_CREAM | Freq: Every day | VAGINAL | 0 refills | Status: AC
  Filled 2023-08-07: qty 40, 7d supply, fill #0

## 2023-09-02 ENCOUNTER — Other Ambulatory Visit (HOSPITAL_COMMUNITY): Payer: Self-pay

## 2023-09-03 ENCOUNTER — Other Ambulatory Visit (HOSPITAL_COMMUNITY): Payer: Self-pay

## 2023-09-03 MED ORDER — MIRTAZAPINE 15 MG PO TABS
15.0000 mg | ORAL_TABLET | Freq: Every day | ORAL | 1 refills | Status: DC
  Filled 2023-09-03 – 2023-10-01 (×2): qty 30, 30d supply, fill #0
  Filled 2023-11-01: qty 30, 30d supply, fill #1

## 2023-09-15 ENCOUNTER — Other Ambulatory Visit (HOSPITAL_COMMUNITY): Payer: Self-pay

## 2023-10-01 ENCOUNTER — Other Ambulatory Visit: Payer: Self-pay

## 2023-10-01 ENCOUNTER — Other Ambulatory Visit (HOSPITAL_COMMUNITY): Payer: Self-pay

## 2024-01-26 ENCOUNTER — Other Ambulatory Visit (HOSPITAL_COMMUNITY): Payer: Self-pay

## 2024-01-26 MED ORDER — INTEGRA F 125-1 MG PO CAPS
1.0000 | ORAL_CAPSULE | Freq: Every day | ORAL | 3 refills | Status: DC
  Filled 2024-01-26: qty 30, 30d supply, fill #0
  Filled 2024-02-29: qty 30, 30d supply, fill #1
  Filled 2024-04-05: qty 30, 30d supply, fill #2
  Filled 2024-05-12: qty 30, 30d supply, fill #3

## 2024-01-27 ENCOUNTER — Other Ambulatory Visit (HOSPITAL_COMMUNITY): Payer: Self-pay

## 2024-03-01 ENCOUNTER — Other Ambulatory Visit (HOSPITAL_COMMUNITY): Payer: Self-pay

## 2024-03-16 ENCOUNTER — Other Ambulatory Visit: Payer: Self-pay | Admitting: Internal Medicine

## 2024-03-16 DIAGNOSIS — Z0000 Encounter for general adult medical examination without abnormal findings: Secondary | ICD-10-CM

## 2024-04-05 ENCOUNTER — Other Ambulatory Visit (HOSPITAL_COMMUNITY): Payer: Self-pay

## 2024-04-05 ENCOUNTER — Other Ambulatory Visit: Payer: Self-pay

## 2024-04-05 MED ORDER — MIRTAZAPINE 15 MG PO TABS
15.0000 mg | ORAL_TABLET | Freq: Every day | ORAL | 2 refills | Status: AC
  Filled 2024-04-05: qty 30, 30d supply, fill #0
  Filled 2024-05-12: qty 30, 30d supply, fill #1
  Filled 2024-08-18: qty 30, 30d supply, fill #2

## 2024-04-06 ENCOUNTER — Ambulatory Visit
Admission: RE | Admit: 2024-04-06 | Discharge: 2024-04-06 | Disposition: A | Discharge status: Home / Self Care | Source: Ambulatory Visit | Attending: Internal Medicine | Admitting: Internal Medicine

## 2024-04-06 DIAGNOSIS — Z0000 Encounter for general adult medical examination without abnormal findings: Secondary | ICD-10-CM

## 2024-05-13 ENCOUNTER — Other Ambulatory Visit (HOSPITAL_COMMUNITY): Payer: Self-pay

## 2024-05-13 ENCOUNTER — Other Ambulatory Visit: Payer: Self-pay

## 2024-06-02 ENCOUNTER — Other Ambulatory Visit (HOSPITAL_COMMUNITY): Payer: Self-pay

## 2024-06-02 MED ORDER — INTEGRA F 125-1 MG PO CAPS
1.0000 | ORAL_CAPSULE | Freq: Every day | ORAL | 0 refills | Status: DC
  Filled 2024-06-02 – 2024-06-04 (×2): qty 30, 30d supply, fill #0

## 2024-06-04 ENCOUNTER — Other Ambulatory Visit (HOSPITAL_COMMUNITY): Payer: Self-pay

## 2024-06-07 ENCOUNTER — Other Ambulatory Visit (HOSPITAL_COMMUNITY): Payer: Self-pay

## 2024-06-08 ENCOUNTER — Other Ambulatory Visit (HOSPITAL_COMMUNITY): Payer: Self-pay

## 2024-06-09 ENCOUNTER — Other Ambulatory Visit (HOSPITAL_COMMUNITY): Payer: Self-pay

## 2024-07-20 ENCOUNTER — Other Ambulatory Visit (HOSPITAL_COMMUNITY): Payer: Self-pay

## 2024-07-20 MED ORDER — INTEGRA F 125-1 MG PO CAPS
1.0000 | ORAL_CAPSULE | Freq: Every day | ORAL | 2 refills | Status: AC
  Filled 2024-07-20: qty 30, 30d supply, fill #0
  Filled 2024-08-18: qty 30, 30d supply, fill #1
  Filled 2024-10-04 – 2024-10-15 (×2): qty 30, 30d supply, fill #2

## 2024-08-18 ENCOUNTER — Other Ambulatory Visit: Payer: Self-pay

## 2024-08-19 ENCOUNTER — Other Ambulatory Visit: Payer: Self-pay

## 2024-08-19 MED ORDER — METRONIDAZOLE 500 MG PO TABS
500.0000 mg | ORAL_TABLET | Freq: Two times a day (BID) | ORAL | 0 refills | Status: AC
  Filled 2024-08-19 – 2024-08-20 (×2): qty 14, 7d supply, fill #0

## 2024-08-20 ENCOUNTER — Other Ambulatory Visit (HOSPITAL_COMMUNITY): Payer: Self-pay

## 2024-08-23 ENCOUNTER — Other Ambulatory Visit: Payer: Self-pay

## 2024-10-04 ENCOUNTER — Other Ambulatory Visit: Payer: Self-pay

## 2024-10-04 ENCOUNTER — Other Ambulatory Visit (HOSPITAL_COMMUNITY): Payer: Self-pay

## 2024-10-05 ENCOUNTER — Other Ambulatory Visit (HOSPITAL_COMMUNITY): Payer: Self-pay

## 2024-10-15 ENCOUNTER — Other Ambulatory Visit (HOSPITAL_COMMUNITY): Payer: Self-pay

## 2024-10-15 ENCOUNTER — Encounter

## 2024-10-15 MED ORDER — FLUCONAZOLE 200 MG PO TABS
200.0000 mg | ORAL_TABLET | Freq: Every day | ORAL | 0 refills | Status: AC
  Filled 2024-10-15: qty 2, 4d supply, fill #0
